# Patient Record
Sex: Male | Born: 2005 | Race: White | Hispanic: No | Marital: Single | Smoking: Never smoker
Health system: Southern US, Community
[De-identification: ages and names within clinical notes are randomized; demographics above are authoritative.]

## PROBLEM LIST (undated history)

## (undated) DIAGNOSIS — F329 Major depressive disorder, single episode, unspecified: Secondary | ICD-10-CM

## (undated) DIAGNOSIS — F419 Anxiety disorder, unspecified: Secondary | ICD-10-CM

## (undated) DIAGNOSIS — F909 Attention-deficit hyperactivity disorder, unspecified type: Secondary | ICD-10-CM

## (undated) DIAGNOSIS — Z7289 Other problems related to lifestyle: Secondary | ICD-10-CM

## (undated) DIAGNOSIS — N200 Calculus of kidney: Secondary | ICD-10-CM

## (undated) DIAGNOSIS — Z789 Other specified health status: Secondary | ICD-10-CM

---
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
## 2024-04-19 ENCOUNTER — Ambulatory Visit (HOSPITAL_COMMUNITY)
Admission: EM | Admit: 2024-04-19 | Discharge: 2024-04-19 | Disposition: A | Discharge status: Home / Self Care | Payer: MEDICAID | Attending: Psychiatry | Admitting: Psychiatry

## 2024-04-19 DIAGNOSIS — F319 Bipolar disorder, unspecified: Secondary | ICD-10-CM | POA: Insufficient documentation

## 2024-04-19 DIAGNOSIS — F4010 Social phobia, unspecified: Secondary | ICD-10-CM | POA: Insufficient documentation

## 2024-04-19 DIAGNOSIS — F909 Attention-deficit hyperactivity disorder, unspecified type: Secondary | ICD-10-CM | POA: Insufficient documentation

## 2024-04-19 DIAGNOSIS — Z760 Encounter for issue of repeat prescription: Secondary | ICD-10-CM

## 2024-04-19 DIAGNOSIS — Z79899 Other long term (current) drug therapy: Secondary | ICD-10-CM | POA: Insufficient documentation

## 2024-04-19 MED ORDER — LYBALVI 5-10 MG PO TABS: 10.0000 mg | ORAL_TABLET | Freq: Every day | ORAL | 0 refills | Status: AC

## 2024-04-19 MED ORDER — VILOXAZINE HCL ER 200 MG PO CP24: 200.0000 mg | ORAL_CAPSULE | Freq: Every day | ORAL | 0 refills | Status: AC
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
## 2024-04-19 NOTE — Discharge Instructions (Addendum)
  Based on what you have shared, a list of resources for outpatient therapy and psychiatry is provided below to get you started back on treatment.  It is imperative that you follow through with treatment within 5-7 days from the day of discharge to prevent any further risk to your safety or mental well-being.  You are not limited to the list provided.  In case of an urgent crisis, you may contact the Mobile Crisis Unit with [facility] at [phone].        Outpatient Services for Therapy and Medication Management for [facility]  [facility] [address], [place], [address] [phone] phone  New Patient Assessment/Therapy Walk-ins Monday, Wednesday, and Thursday arrive by 7:15 am     NO ASSESSMENT/THERAPY WALK-INS ON TUESDAYS OR THURSDAYS  New Patient Psychiatry/Medication Management Walk-ins Monday-Friday: 8am-11am  For all walk-ins, we ask that you arrive by 7:15am because patient will be seen in the order of arrival.  Availability is limited; therefore, you may not be seen on the same day that you walk-in.  Our goal is to serve and meet the needs of our community to the best of our ability.   [facility] [address] [place], [place], [address] [phone] phone  [facility] [address] [place], [place], [address] [phone] phone [phone] phone ([address], [address], Anthem/Elevance, [address], [place], [address], [address], Healthy [place], [place], [place], [address], [employer]oco[employer]ill[employer], [place], UHC, [employer] Financial, [place], Out of Network)  [employer], [place] [address] [place], [place], [address] [phone] phone ([address], Anthem/Elevance, San[employer]SCI Options/Carelon, BCBS, [address], [place], [place], [place], [place], [employer], [facility], [place], [place], [facility])  [employer] [address] [place], [place], [address] [phone] phone (Medicaid, ask about other insurance)  [facility] [address] [place], [place], [address] [phone] phone [phone] fax ([address], [place] , [place], [place], [place] Health Choice, UHC, General Electric, Self-Pay)  Sarah Lempka [address] [place], [place], [address] [phone] phone ([address], Anthem/Elevance, [address], [address], [place], [employer], [place], [place], [place], [employer], [place], [place], [place], [facility])  [facility] - 6-8 MONTH WAIT FOR THERAPY; SOON[facility] FOR MEDICATION MANAGEMENT [address] [place], [place], [address] [phone] phone ([address], AmeriHealth [address] - [place], [address], [place], [place], Friday Health Plans, [address], BCBS [place], [place], [address], [place], [place], [place], [place], Tricare, UHC, [employer]co Corporation, [place])  [facility] [address] [place], [place], [address] [phone] phone  [facility] [address] [place], [place], [address] [phone] phone  [facility] [address] [place], [place], [address] [phone] phone  [facility] - THERAPY ONLY [address]  [place], [place], [address] [phone] phone  [facility], [place] [address], [place], [address] [phone] phone  [facility] [address] [place], [place], [address] [phone] phone [phone] fax  [facility] [address] [place], [place], [address] [phone] phone [phone] fax  [facility] [phone] [address] [place], [place], [address] [phone] phone  Arnold Bicker [address] [place], [place], [address]  [phone] phone  [facility]  (Adults Only) [address] [place], [place], [address]  [phone]  phone [phone] fax

## 2024-04-19 NOTE — ED Provider Notes (Signed)
 [facility] Medical Screening Exam  Patient Name: Kenneth Keller MRN: [id number] Date of Evaluation: 04/19/24 Chief Complaint:  "I am almost out of Lybalvi and Qelbree" Diagnosis:  Final diagnoses:  Medication refill    History of Present illness: Kenneth Keller is a 18 y.o. male patient presented to [facility] as a walk in accompanied by his mother with complaints of "I am almost out of [place] and Qelbree"  Kenneth Keller, 18 y.o., male patient seen face to face by this provider, chart reviewed, and consulted with Dr. Genita Keys on 04/19/24.  Per chart review patient has a past psychiatric history of bipolar disorder.  He lives in the home with his mother.  He works full-time at [employer].  He endorses occasional marijuana use but denies all other substance use.  He endorses a past SI attempt when he was a teenager.  He has had multiple inpatient admissions but has not been hospitalized in the past few years.   Kenneth Keller reports he has services in place with [facility].  This provider Henretta Lodge NP and is prescribed Lybalvi 10 mg daily and Qelbree 200 daily.  Medications refilled at [facility] [place] location.  Contacted pharmacy at this location and patient last filled Qelbree 03/08/2024. [name] was last filled 02/06/2024.  Patient reports that his provider had considered increasing medication and gave him multiple bottles of samples.  He does present with a bottle of several medications with 5 left in bottle.  He reports compliance.  He feels that his mood has been stable and he is doing well.  He is denying any depression or anxiety.  He denies any mood instability.  He presents today because his provider quit in there is no other provider in the office that is willing to observe him.  They did refer him out but they got a call yesterday stating that the office that he was referred to is not accepting any new patients.  Patient is afraid if he does not have a new provider and a  refill of his medications that he will decompensate.  Discussed multiple outpatient medication management and therapy resources.  Including open access walk-in hours for [facility] on the second floor.  Patient agrees to present however he currently does not drive.  His mother is on contract as a physical therapist and  cannot provide him transportation this week.  Reports it would be next week before they would be able to present.  Provide 14-day prescription for Lybalvi and Qelbree.  Educated on medication and any adverse reactions.  Patient's mother and patient were provided opportunity ask questions or voice any concerns.  During evaluation [name] is  sitting in assessment room with no noted distress.  He is alert/oriented x 4, calm, cooperative, attentive, and responses were relevant and appropriate to assessment questions.  He spoke in a clear tone at moderate volume, and normal pace, with good eye contact.   He denies suicidal/self-harm/homicidal ideation, psychosis, and paranoia.  Objectively there is no evidence of psychosis/mania or delusional thinking.   He conversed coherently, with goal directed thoughts, and no distractibility, or pre-occupation and he has denied suicidal/self-harm/homicidal ideation, psychosis, and paranoia.   At this time Kenneth Keller is educated and verbalizes understanding of mental health resources and other crisis services in the community. He is instructed to call 911 and present to the nearest emergency room should he experience any suicidal/homicidal ideation, auditory/visual/hallucinations, or detrimental worsening of his mental health condition.  He  was a also advised by Clinical research associate that he could call the toll-free phone on back of  insurance card to assist with identifying counselors and agencies in network.     Flowsheet Row ED from 04/19/2024 in [facility] ED from 07/13/2021 in [facility] Emergency Department at [facility] ED to  Hosp-Admission (Discharged) from 07/07/2021 in [facility] PEDIATRICS  C-SSRS RISK CATEGORY No Risk Low Risk No Risk       Psychiatric Specialty Exam  Presentation  General Appearance:Appropriate for Environment; Casual  Eye Contact:Good  Speech:Clear and Coherent; Normal Rate  Speech Volume:Normal  Handedness:Right   Mood and Affect  Mood: Euthymic  Affect: Congruent   Thought Process  Thought Processes: Coherent  Descriptions of Associations:Intact  Orientation:Full (Time, Place and Person)  Thought Content:Logical    Hallucinations:None  Ideas of Reference:None  Suicidal Thoughts:No  Homicidal Thoughts:No   Sensorium  Memory: Immediate Good; Recent Good; Remote Good  Judgment:No data recorded Insight: Good   Executive Functions  Concentration: Good  Attention Span: Good  Recall: Good  Fund of Knowledge: Good  Language: Good   Psychomotor Activity  Psychomotor Activity: Normal   Assets  Assets: Housing; Manufacturing systems engineer; Desire for Improvement; Financial Resources/Insurance; Physical Health; Resilience; Social Support   Sleep  Sleep: Good  Number of hours: No data recorded  Physical Exam: Physical Exam Constitutional:      Appearance: Normal appearance.  Eyes:     General:        Right eye: No discharge.        Left eye: No discharge.  Cardiovascular:     Rate and Rhythm: Normal rate.  Pulmonary:     Effort: Pulmonary effort is normal. No respiratory distress.  Musculoskeletal:        General: Normal range of motion.     Cervical back: Normal range of motion.  Neurological:     Mental Status: He is alert and oriented to person, place, and time.  Psychiatric:        Attention and Perception: Attention and perception normal.        Mood and Affect: Mood and affect normal.        Speech: Speech normal.        Behavior: Behavior normal. Behavior is cooperative.        Thought Content:  Thought content normal.        Cognition and Memory: Cognition normal.        Judgment: Judgment normal.    Review of Systems  Constitutional:  Negative for chills and fever.  HENT:  Negative for hearing loss.   Respiratory:  Positive for shortness of breath. Negative for cough.   Cardiovascular:  Negative for chest pain.  Musculoskeletal: Negative.   Neurological:  Negative for tremors.   There were no vitals taken for this visit. There is no height or weight on file to calculate BMI.  Musculoskeletal: Strength & Muscle Tone: within normal limits Gait & Station: normal Patient leans: N/A   [facility] MSE Discharge Disposition for Follow up and Recommendations: Based on my evaluation the patient does not appear to have an emergency medical condition and can be discharged with resources and follow up care in outpatient services for Medication Management and Individual Therapy  Discharge patient   Provided out patient psychiatric resources for medication management, including open access walk in hours for [facility] on the 2cd floor  Provided 14 day prescription for Qelbree 200 mg every  day and Lybalvi 10 mg daily as bridge   Costella Dirks, NP 04/19/2024, 5:54 PM

## 2024-04-20 ENCOUNTER — Emergency Department (HOSPITAL_BASED_OUTPATIENT_CLINIC_OR_DEPARTMENT_OTHER): Payer: MEDICAID

## 2024-04-20 ENCOUNTER — Other Ambulatory Visit: Payer: Self-pay

## 2024-04-20 ENCOUNTER — Encounter (HOSPITAL_BASED_OUTPATIENT_CLINIC_OR_DEPARTMENT_OTHER): Payer: Self-pay | Admitting: Emergency Medicine

## 2024-04-20 ENCOUNTER — Emergency Department (HOSPITAL_BASED_OUTPATIENT_CLINIC_OR_DEPARTMENT_OTHER)
Admission: EM | Admit: 2024-04-20 | Discharge: 2024-04-20 | Disposition: A | Discharge status: Home / Self Care | Payer: MEDICAID | Attending: Emergency Medicine | Admitting: Emergency Medicine

## 2024-04-20 DIAGNOSIS — R1013 Epigastric pain: Secondary | ICD-10-CM | POA: Diagnosis not present

## 2024-04-20 DIAGNOSIS — R112 Nausea with vomiting, unspecified: Secondary | ICD-10-CM | POA: Insufficient documentation

## 2024-04-20 DIAGNOSIS — Z87442 Personal history of urinary calculi: Secondary | ICD-10-CM | POA: Diagnosis not present

## 2024-04-20 LAB — COMPREHENSIVE METABOLIC PANEL WITH GFR
ALT: 24 U/L (ref 0–44)
AST: 36 U/L (ref 15–41)
Albumin: 5.1 g/dL — ABNORMAL HIGH (ref 3.5–5.0)
Alkaline Phosphatase: 66 U/L (ref 38–126)
Anion gap: 18 — ABNORMAL HIGH (ref 5–15)
BUN: 16 mg/dL (ref 6–20)
CO2: 24 mmol/L (ref 22–32)
Calcium: 10.1 mg/dL (ref 8.9–10.3)
Chloride: 100 mmol/L (ref 98–111)
Creatinine, Ser: 1.06 mg/dL (ref 0.61–1.24)
GFR, Estimated: >60 mL/min (ref >60)
Glucose, Bld: 143 mg/dL — ABNORMAL HIGH (ref 70–99)
Potassium: 3.3 mmol/L — ABNORMAL LOW (ref 3.5–5.1)
Sodium: 141 mmol/L (ref 135–145)
Total Bilirubin: 0.3 mg/dL (ref 0.0–1.2)
Total Protein: 7.5 g/dL (ref 6.5–8.1)

## 2024-04-20 LAB — CBC WITH DIFFERENTIAL/PLATELET
Abs Immature Granulocytes: 0.09 10*3/uL — ABNORMAL HIGH (ref 0.00–0.07)
Basophils Absolute: 0.1 10*3/uL (ref 0.0–0.1)
Basophils Relative: 0 %
Eosinophils Absolute: 0.1 10*3/uL (ref 0.0–0.5)
Eosinophils Relative: 1 %
HCT: 44 % (ref 39.0–52.0)
Hemoglobin: 15.5 g/dL (ref 13.0–17.0)
Immature Granulocytes: 0 %
Lymphocytes Relative: 7 %
Lymphs Abs: 1.5 10*3/uL (ref 0.7–4.0)
MCH: 29.8 pg (ref 26.0–34.0)
MCHC: 35.2 g/dL (ref 30.0–36.0)
MCV: 84.6 fL (ref 80.0–100.0)
Monocytes Absolute: 1.6 10*3/uL — ABNORMAL HIGH (ref 0.1–1.0)
Monocytes Relative: 7 %
Neutro Abs: 18.3 10*3/uL — ABNORMAL HIGH (ref 1.7–7.7)
Neutrophils Relative %: 85 %
Platelets: 372 10*3/uL (ref 150–400)
RBC: 5.2 MIL/uL (ref 4.22–5.81)
RDW: 12.2 % (ref 11.5–15.5)
WBC: 22.1 10*3/uL — ABNORMAL HIGH (ref 4.0–10.5)
nRBC: 0 % (ref 0.0–0.2)

## 2024-04-20 LAB — LIPASE, BLOOD: Lipase: 40 U/L (ref 11–51)

## 2024-04-20 MED ORDER — MORPHINE SULFATE (PF) 4 MG/ML IV SOLN
4.0000 mg | Freq: Once | INTRAVENOUS | Status: AC
  Administered 2024-04-20: 4 mg via INTRAVENOUS
  Filled 2024-04-20: qty 1

## 2024-04-20 MED ORDER — ONDANSETRON HCL 4 MG/2ML IJ SOLN
4.0000 mg | Freq: Once | INTRAMUSCULAR | Status: AC
  Administered 2024-04-20: 4 mg via INTRAVENOUS
  Filled 2024-04-20: qty 2

## 2024-04-20 MED ORDER — ONDANSETRON 4 MG PO TBDP: 4.0000 mg | ORAL_TABLET | Freq: Three times a day (TID) | ORAL | 0 refills | Status: DC | PRN

## 2024-04-20 MED ORDER — DROPERIDOL 2.5 MG/ML IJ SOLN
1.2500 mg | Freq: Once | INTRAMUSCULAR | Status: AC
  Administered 2024-04-20: 1.25 mg via INTRAVENOUS
  Filled 2024-04-20: qty 2

## 2024-04-20 MED ORDER — KETOROLAC TROMETHAMINE 30 MG/ML IJ SOLN
30.0000 mg | Freq: Once | INTRAMUSCULAR | Status: AC
  Administered 2024-04-20: 30 mg via INTRAVENOUS
  Filled 2024-04-20: qty 1

## 2024-04-20 MED ORDER — SUCRALFATE 1 G PO TABS: 1.0000 g | ORAL_TABLET | Freq: Three times a day (TID) | ORAL | 0 refills | Status: DC

## 2024-04-20 MED ORDER — SODIUM CHLORIDE 0.9 % IV BOLUS
1000.0000 mL | Freq: Once | INTRAVENOUS | Status: AC
  Administered 2024-04-20: 1000 mL via INTRAVENOUS

## 2024-04-20 MED ORDER — IOHEXOL 300 MG/ML  SOLN
100.0000 mL | Freq: Once | INTRAMUSCULAR | Status: AC | PRN
  Administered 2024-04-20: 80 mL via INTRAVENOUS

## 2024-04-20 NOTE — Discharge Instructions (Signed)
 You were seen today for nausea and vomiting.  Your workup is largely reassuring.  Make sure that you are staying hydrated.  Take Zofran  as needed for persistent nausea.  Consider stopping marijuana use as this can sometimes be associated with persistent nausea and vomiting.

## 2024-04-20 NOTE — ED Triage Notes (Signed)
 Pt arrives w/ mom w/ c/o abd pain, n/v that began tonight. Reports same thing happened a week ago and had no answers for his s/s.

## 2024-04-20 NOTE — ED Notes (Signed)
 Questions and concerns addressed. Discharge teaching completed.   Prescriptions reviewed and pharmacy verified.   Pt ambulatory out of ED with mother.

## 2024-04-20 NOTE — ED Notes (Signed)
 Patient tolerated PO challenge

## 2024-04-20 NOTE — ED Provider Notes (Signed)
 [facility] EMERGENCY DEPARTMENT AT [facility] Provider Note   CSN: [id number] Arrival date & time: 04/20/24  0240     History  Chief Complaint  Patient presents with   Abdominal Pain   Emesis    Florence Antonelli is a 18 y.o. male.  HPI     This is a 18 year old male who presents with nausea and vomiting.  Patient states that he has had persistent nausea and vomiting since earlier this evening.  Previously has been seen for this and "was not given an answer."  Reports epigastric discomfort and burning with vomiting.  No change in stools.  Does report marijuana use.  No sick contacts.  No fevers.  Home Medications Prior to Admission medications   Medication Sig Start Date End Date Taking? Authorizing Provider  ondansetron  (ZOFRAN -ODT) 4 MG disintegrating tablet Take 1 tablet (4 mg total) by mouth every 8 (eight) hours as needed. 04/20/24  Yes Banesa Tristan, Vonzella Guernsey, MD  sucralfate (CARAFATE) 1 g tablet Take 1 tablet (1 g total) by mouth 4 (four) times daily -  with meals and at bedtime. 04/20/24  Yes Morna Flud, Vonzella Guernsey, MD  cetirizine (ZYRTEC) 10 MG tablet Take 10 mg by mouth daily.    [provider]  OLANZapine-Samidorphan (LYBALVI) 5-10 MG TABS Take 10 mg by mouth daily for 14 days. 04/19/24 05/03/24  Costella Dirks, NP  viloxazine ER (QELBREE) 200 MG 24 hr capsule Take 1 capsule (200 mg total) by mouth daily. 04/19/24   Costella Dirks, NP      Allergies    Other    Review of Systems   Review of Systems  Constitutional:  Negative for fever.  Respiratory:  Negative for shortness of breath.   Cardiovascular:  Negative for chest pain.  Gastrointestinal:  Positive for abdominal pain, nausea and vomiting. Negative for constipation and diarrhea.  All other systems reviewed and are negative.   Physical Exam Updated Vital Signs BP 123/81   Pulse 66   Temp 97.8 F (36.6 C) (Oral)   Resp 15   Ht 1.613 m (5' 3.5")   Wt 65.8 kg   SpO2 100%   BMI 25.28 kg/m   Physical Exam Vitals and nursing note reviewed.  Constitutional:      Appearance: He is well-developed. He is ill-appearing. He is not toxic-appearing.     Comments: Actively retching  HENT:     Head: Normocephalic and atraumatic.  Eyes:     Pupils: Pupils are equal, round, and reactive to light.  Cardiovascular:     Rate and Rhythm: Normal rate and regular rhythm.     Heart sounds: Normal heart sounds. No murmur heard. Pulmonary:     Effort: Pulmonary effort is normal. No respiratory distress.     Breath sounds: Normal breath sounds. No wheezing.  Abdominal:     Palpations: Abdomen is soft.     Tenderness: There is abdominal tenderness in the epigastric area.  Musculoskeletal:     Cervical back: Neck supple.  Lymphadenopathy:     Cervical: No cervical adenopathy.  Skin:    General: Skin is warm and dry.  Neurological:     Mental Status: He is alert and oriented to person, place, and time.  Psychiatric:        Mood and Affect: Mood normal.     ED Results / Procedures / Treatments   Labs (all labs ordered are listed, but only abnormal results are displayed) Labs Reviewed  CBC WITH DIFFERENTIAL/PLATELET -  Abnormal; Notable for the following components:      Result Value   WBC 22.1 (*)    Neutro Abs 18.3 (*)    Monocytes Absolute 1.6 (*)    Abs Immature Granulocytes 0.09 (*)    All other components within normal limits  COMPREHENSIVE METABOLIC PANEL WITH GFR - Abnormal; Notable for the following components:   Potassium 3.3 (*)    Glucose, Bld 143 (*)    Albumin 5.1 (*)    Anion gap 18 (*)    All other components within normal limits  LIPASE, BLOOD    EKG None  Radiology CT ABDOMEN PELVIS W CONTRAST Result Date: 04/20/2024 CLINICAL DATA:  Abdominal pain and vomiting. Small-bowel obstruction suspected. EXAM: CT ABDOMEN AND PELVIS WITH CONTRAST TECHNIQUE: Multidetector CT imaging of the abdomen and pelvis was performed using the standard protocol following bolus  administration of intravenous contrast. RADIATION DOSE REDUCTION: This exam was performed according to the departmental dose-optimization program which includes automated exposure control, adjustment of the mA and/or kV according to patient size and/or use of iterative reconstruction technique. CONTRAST:  80mL OMNIPAQUE  IOHEXOL  300 MG/ML  SOLN COMPARISON:  CT with IV contrast 09/30/2019 FINDINGS: Lower chest: Lung bases are clear. The cardiac size is normal. There is a new small hiatal hernia. Hepatobiliary: No focal liver abnormality is seen. No gallstones, gallbladder wall thickening, or biliary dilatation. Pancreas: No abnormality. Spleen: No abnormality.  No splenomegaly. Adrenals/Urinary Tract: No abnormality. Stomach/Bowel: Unremarkable gastric wall. The unopacified small bowel is normal caliber without inflammatory changes. The appendix is normal. The wall of the colon is normal in thickness. Mild fecal stasis. Vascular/Lymphatic: No significant vascular findings are present. No enlarged abdominal or pelvic lymph nodes. Reproductive: Prostate is unremarkable. Other: Small umbilical fat hernia. No incarcerated hernia. No free fluid, free hemorrhage or free air. Pelvic phleboliths. Musculoskeletal: No acute or significant osseous findings. IMPRESSION: 1. No acute CT findings in the abdomen or pelvis. 2. Constipation. 3. Small hiatal hernia. 4. Small umbilical fat hernia. Electronically Signed   By: Denman Fischer M.D.   On: 04/20/2024 05:16    Procedures Procedures    Medications Ordered in ED Medications  ketorolac  (TORADOL ) 30 MG/ML injection 30 mg (has no administration in time range)  sodium chloride  0.9 % bolus 1,000 mL (0 mLs Intravenous Stopped 04/20/24 0426)  ondansetron  (ZOFRAN ) injection 4 mg (4 mg Intravenous Given 04/20/24 0312)  morphine  (PF) 4 MG/ML injection 4 mg (4 mg Intravenous Given 04/20/24 0406)  iohexol  (OMNIPAQUE ) 300 MG/ML solution 100 mL (80 mLs Intravenous Contrast Given 04/20/24  0503)  droperidol (INAPSINE) 2.5 MG/ML injection 1.25 mg (1.25 mg Intravenous Given 04/20/24 0439)    ED Course/ Medical Decision Making/ A&P Clinical Course as of 04/20/24 0558  Mon Apr 20, 2024  0539 Patient tolerating fluids.  Recommend that he stop marijuana use. [CH]    Clinical Course User Index [CH] Braley Luckenbaugh, Vonzella Guernsey, MD                                 Medical Decision Making Amount and/or Complexity of Data Reviewed Labs: ordered. Radiology: ordered.  Risk Prescription drug management.   This patient presents to the ED for concern of abdominal pain, nausea, vomiting, this involves an extensive number of treatment options, and is a complaint that carries with it a high risk of complications and morbidity.  I considered the following differential and admission for this acute,  potentially life threatening condition.  The differential diagnosis includes gastritis, gastroenteritis, cannabinoid hyperemesis, SBO, pancreatitis, cholecystitis  MDM:    This is a 18 year old male who presents with nausea and vomiting.  He is actively retching on my initial evaluation and appears uncomfortable.  Labs obtained.  Leukocytosis to 22.  Potassium 3.3.  Patient was given fluids, pain, nausea medication.  CT imaging obtained.  CT does not show any evidence of acute finding.  He does have a small hiatal hernia which could be worsening symptoms.  Would also consider marijuana as a culprit.  Ultimately patient was able to p.o. challenge.  Will discharge with Carafate and Zofran  for symptoms.  Will give GI follow-up.  (Labs, imaging, consults)  Labs: I Ordered, and personally interpreted labs.  The pertinent results include: CBC, CMP, lipase  Imaging Studies ordered: I ordered imaging studies including CT I independently visualized and interpreted imaging. I agree with the radiologist interpretation  Additional history obtained from chart review.  External records from outside source obtained  and reviewed including prior evaluations  Cardiac Monitoring: The patient was maintained on a cardiac monitor.  If on the cardiac monitor, I personally viewed and interpreted the cardiac monitored which showed an underlying rhythm of: Sinus  Reevaluation: After the interventions noted above, I reevaluated the patient and found that they have :improved  Social Determinants of Health:  lives independently  Disposition: Discharge  Co morbidities that complicate the patient evaluation  Past Medical History:  Diagnosis Date   ADHD    Anxiety    Kidney stones    MDD (major depressive disorder)    Self-mutilation      Medicines Meds ordered this encounter  Medications   sodium chloride  0.9 % bolus 1,000 mL   ondansetron  (ZOFRAN ) injection 4 mg   morphine  (PF) 4 MG/ML injection 4 mg   iohexol  (OMNIPAQUE ) 300 MG/ML solution 100 mL   droperidol (INAPSINE) 2.5 MG/ML injection 1.25 mg   ketorolac  (TORADOL ) 30 MG/ML injection 30 mg   ondansetron  (ZOFRAN -ODT) 4 MG disintegrating tablet    Sig: Take 1 tablet (4 mg total) by mouth every 8 (eight) hours as needed.    Dispense:  20 tablet    Refill:  0   sucralfate (CARAFATE) 1 g tablet    Sig: Take 1 tablet (1 g total) by mouth 4 (four) times daily -  with meals and at bedtime.    Dispense:  90 tablet    Refill:  0    I have reviewed the patients home medicines and have made adjustments as needed  Problem List / ED Course: Problem List Items Addressed This Visit   None Visit Diagnoses       Nausea and vomiting, unspecified vomiting type    -  Primary                   Final Clinical Impression(s) / ED Diagnoses Final diagnoses:  Nausea and vomiting, unspecified vomiting type    Rx / DC Orders ED Discharge Orders          Ordered    ondansetron  (ZOFRAN -ODT) 4 MG disintegrating tablet  Every 8 hours PRN        04/20/24 0557    sucralfate (CARAFATE) 1 g tablet  3 times daily with meals & bedtime         04/20/24 0557              Rory Collard, MD 04/20/24 [phone]

## 2024-06-01 ENCOUNTER — Inpatient Hospital Stay (HOSPITAL_BASED_OUTPATIENT_CLINIC_OR_DEPARTMENT_OTHER)
Admission: EM | Admit: 2024-06-01 | Discharge: 2024-06-03 | DRG: 392 | Disposition: A | Discharge status: Home / Self Care | Payer: MEDICAID | Attending: Internal Medicine | Admitting: Internal Medicine

## 2024-06-01 ENCOUNTER — Other Ambulatory Visit: Payer: Self-pay

## 2024-06-01 ENCOUNTER — Emergency Department (HOSPITAL_BASED_OUTPATIENT_CLINIC_OR_DEPARTMENT_OTHER): Payer: MEDICAID

## 2024-06-01 ENCOUNTER — Encounter (HOSPITAL_BASED_OUTPATIENT_CLINIC_OR_DEPARTMENT_OTHER): Payer: Self-pay

## 2024-06-01 DIAGNOSIS — F12188 Cannabis abuse with other cannabis-induced disorder: Secondary | ICD-10-CM | POA: Diagnosis present

## 2024-06-01 DIAGNOSIS — F32A Depression, unspecified: Secondary | ICD-10-CM | POA: Diagnosis present

## 2024-06-01 DIAGNOSIS — R9431 Abnormal electrocardiogram [ECG] [EKG]: Secondary | ICD-10-CM | POA: Diagnosis present

## 2024-06-01 DIAGNOSIS — D72829 Elevated white blood cell count, unspecified: Secondary | ICD-10-CM | POA: Diagnosis present

## 2024-06-01 DIAGNOSIS — R1110 Vomiting, unspecified: Principal | ICD-10-CM

## 2024-06-01 DIAGNOSIS — R519 Headache, unspecified: Secondary | ICD-10-CM | POA: Diagnosis present

## 2024-06-01 DIAGNOSIS — K449 Diaphragmatic hernia without obstruction or gangrene: Secondary | ICD-10-CM | POA: Diagnosis present

## 2024-06-01 DIAGNOSIS — F329 Major depressive disorder, single episode, unspecified: Secondary | ICD-10-CM | POA: Diagnosis present

## 2024-06-01 DIAGNOSIS — R112 Nausea with vomiting, unspecified: Secondary | ICD-10-CM | POA: Diagnosis not present

## 2024-06-01 DIAGNOSIS — F909 Attention-deficit hyperactivity disorder, unspecified type: Secondary | ICD-10-CM | POA: Diagnosis present

## 2024-06-01 DIAGNOSIS — K529 Noninfective gastroenteritis and colitis, unspecified: Secondary | ICD-10-CM | POA: Diagnosis present

## 2024-06-01 DIAGNOSIS — Z9152 Personal history of nonsuicidal self-harm: Secondary | ICD-10-CM

## 2024-06-01 DIAGNOSIS — E872 Acidosis, unspecified: Secondary | ICD-10-CM | POA: Diagnosis present

## 2024-06-01 DIAGNOSIS — Z8249 Family history of ischemic heart disease and other diseases of the circulatory system: Secondary | ICD-10-CM

## 2024-06-01 DIAGNOSIS — E876 Hypokalemia: Secondary | ICD-10-CM | POA: Diagnosis not present

## 2024-06-01 DIAGNOSIS — F39 Unspecified mood [affective] disorder: Secondary | ICD-10-CM | POA: Diagnosis present

## 2024-06-01 DIAGNOSIS — F1210 Cannabis abuse, uncomplicated: Secondary | ICD-10-CM | POA: Diagnosis present

## 2024-06-01 DIAGNOSIS — F419 Anxiety disorder, unspecified: Secondary | ICD-10-CM | POA: Diagnosis present

## 2024-06-01 DIAGNOSIS — F1219 Cannabis abuse with unspecified cannabis-induced disorder: Secondary | ICD-10-CM | POA: Diagnosis present

## 2024-06-01 LAB — CBC WITH DIFFERENTIAL/PLATELET
Abs Immature Granulocytes: 0.07 K/uL (ref 0.00–0.07)
Basophils Absolute: 0.1 K/uL (ref 0.0–0.1)
Basophils Relative: 1 %
Eosinophils Absolute: 0.1 K/uL (ref 0.0–0.5)
Eosinophils Relative: 1 %
HCT: 44.4 % (ref 39.0–52.0)
Hemoglobin: 15.7 g/dL (ref 13.0–17.0)
Immature Granulocytes: 0 %
Lymphocytes Relative: 7 %
Lymphs Abs: 1.2 K/uL (ref 0.7–4.0)
MCH: 29.3 pg (ref 26.0–34.0)
MCHC: 35.4 g/dL (ref 30.0–36.0)
MCV: 82.8 fL (ref 80.0–100.0)
Monocytes Absolute: 1.1 K/uL — ABNORMAL HIGH (ref 0.1–1.0)
Monocytes Relative: 7 %
Neutro Abs: 15 K/uL — ABNORMAL HIGH (ref 1.7–7.7)
Neutrophils Relative %: 84 %
Platelets: 304 K/uL (ref 150–400)
RBC: 5.36 MIL/uL (ref 4.22–5.81)
RDW: 11.7 % (ref 11.5–15.5)
WBC: 17.7 K/uL — ABNORMAL HIGH (ref 4.0–10.5)
nRBC: 0 % (ref 0.0–0.2)

## 2024-06-01 LAB — RAPID URINE DRUG SCREEN, HOSP PERFORMED
Amphetamines: NOT DETECTED
Barbiturates: NOT DETECTED
Benzodiazepines: POSITIVE — AB
Cocaine: NOT DETECTED
Opiates: POSITIVE — AB
Tetrahydrocannabinol: POSITIVE — AB

## 2024-06-01 LAB — URINALYSIS, ROUTINE W REFLEX MICROSCOPIC
Bilirubin Urine: NEGATIVE
Glucose, UA: NEGATIVE mg/dL
Hgb urine dipstick: NEGATIVE
Ketones, ur: 80 mg/dL — AB
Leukocytes,Ua: NEGATIVE
Nitrite: NEGATIVE
Protein, ur: NEGATIVE mg/dL
Specific Gravity, Urine: 1.028 (ref 1.005–1.030)
pH: 6 (ref 5.0–8.0)

## 2024-06-01 LAB — COMPREHENSIVE METABOLIC PANEL WITH GFR
ALT: 24 U/L (ref 0–44)
AST: 38 U/L (ref 15–41)
Albumin: 5.3 g/dL — ABNORMAL HIGH (ref 3.5–5.0)
Alkaline Phosphatase: 59 U/L (ref 38–126)
Anion gap: 19 — ABNORMAL HIGH (ref 5–15)
BUN: 17 mg/dL (ref 6–20)
CO2: 21 mmol/L — ABNORMAL LOW (ref 22–32)
Calcium: 10.2 mg/dL (ref 8.9–10.3)
Chloride: 103 mmol/L (ref 98–111)
Creatinine, Ser: 1.08 mg/dL (ref 0.61–1.24)
GFR, Estimated: >60 mL/min (ref >60)
Glucose, Bld: 136 mg/dL — ABNORMAL HIGH (ref 70–99)
Potassium: 3.1 mmol/L — ABNORMAL LOW (ref 3.5–5.1)
Sodium: 142 mmol/L (ref 135–145)
Total Bilirubin: 0.3 mg/dL (ref 0.0–1.2)
Total Protein: 7.7 g/dL (ref 6.5–8.1)

## 2024-06-01 LAB — LIPASE, BLOOD: Lipase: 225 U/L — ABNORMAL HIGH (ref 11–51)

## 2024-06-01 LAB — MAGNESIUM: Magnesium: 1.9 mg/dL (ref 1.7–2.4)

## 2024-06-01 LAB — HIV ANTIBODY (ROUTINE TESTING W REFLEX): HIV Screen 4th Generation wRfx: NONREACTIVE

## 2024-06-01 MED ORDER — HYDROMORPHONE HCL 1 MG/ML IJ SOLN
1.0000 mg | Freq: Once | INTRAMUSCULAR | Status: AC
  Administered 2024-06-01: 1 mg via INTRAVENOUS
  Filled 2024-06-01: qty 1

## 2024-06-01 MED ORDER — VILOXAZINE HCL ER 200 MG PO CP24: 200.0000 mg | ORAL_CAPSULE | Freq: Every day | ORAL | Status: DC

## 2024-06-01 MED ORDER — SODIUM CHLORIDE 0.9% FLUSH
3.0000 mL | Freq: Two times a day (BID) | INTRAVENOUS | Status: DC
  Administered 2024-06-01 – 2024-06-03 (×5): 3 mL via INTRAVENOUS

## 2024-06-01 MED ORDER — DIAZEPAM 5 MG/ML IJ SOLN
2.5000 mg | Freq: Once | INTRAMUSCULAR | Status: AC
  Administered 2024-06-01: 2.5 mg via INTRAVENOUS
  Filled 2024-06-01: qty 2

## 2024-06-01 MED ORDER — ACETAMINOPHEN 325 MG PO TABS
650.0000 mg | ORAL_TABLET | Freq: Four times a day (QID) | ORAL | Status: DC | PRN
  Administered 2024-06-02: 650 mg via ORAL
  Filled 2024-06-01 (×2): qty 2

## 2024-06-01 MED ORDER — METOCLOPRAMIDE HCL 5 MG/ML IJ SOLN
10.0000 mg | Freq: Once | INTRAMUSCULAR | Status: AC
  Administered 2024-06-01: 10 mg via INTRAVENOUS
  Filled 2024-06-01: qty 2

## 2024-06-01 MED ORDER — IOHEXOL 300 MG/ML  SOLN
100.0000 mL | Freq: Once | INTRAMUSCULAR | Status: AC | PRN
  Administered 2024-06-01: 80 mL via INTRAVENOUS

## 2024-06-01 MED ORDER — PANTOPRAZOLE SODIUM 40 MG IV SOLR
40.0000 mg | Freq: Two times a day (BID) | INTRAVENOUS | Status: DC
  Administered 2024-06-01 – 2024-06-03 (×5): 40 mg via INTRAVENOUS
  Filled 2024-06-01 (×5): qty 10

## 2024-06-01 MED ORDER — PANTOPRAZOLE SODIUM 40 MG IV SOLR
40.0000 mg | Freq: Once | INTRAVENOUS | Status: AC
  Administered 2024-06-01: 40 mg via INTRAVENOUS
  Filled 2024-06-01: qty 10

## 2024-06-01 MED ORDER — ALUM & MAG HYDROXIDE-SIMETH 200-200-20 MG/5ML PO SUSP
30.0000 mL | Freq: Once | ORAL | Status: AC
  Administered 2024-06-01: 30 mL via ORAL
  Filled 2024-06-01: qty 30

## 2024-06-01 MED ORDER — OLANZAPINE-SAMIDORPHAN 10-10 MG PO TABS: 1.0000 | ORAL_TABLET | Freq: Every day | ORAL | Status: DC

## 2024-06-01 MED ORDER — ONDANSETRON HCL 4 MG/2ML IJ SOLN
4.0000 mg | Freq: Once | INTRAMUSCULAR | Status: AC
  Administered 2024-06-01: 4 mg via INTRAVENOUS
  Filled 2024-06-01: qty 2

## 2024-06-01 MED ORDER — ACETAMINOPHEN 650 MG RE SUPP
650.0000 mg | Freq: Four times a day (QID) | RECTAL | Status: DC | PRN
  Administered 2024-06-01 – 2024-06-02 (×2): 650 mg via RECTAL
  Filled 2024-06-01 (×3): qty 1

## 2024-06-01 MED ORDER — POTASSIUM CHLORIDE 10 MEQ/100ML IV SOLN
10.0000 meq | INTRAVENOUS | Status: AC
  Administered 2024-06-01: 10 meq via INTRAVENOUS
  Filled 2024-06-01 (×2): qty 100

## 2024-06-01 MED ORDER — KETOROLAC TROMETHAMINE 15 MG/ML IJ SOLN
15.0000 mg | Freq: Once | INTRAMUSCULAR | Status: AC
  Administered 2024-06-01: 15 mg via INTRAVENOUS
  Filled 2024-06-01: qty 1

## 2024-06-01 MED ORDER — ENOXAPARIN SODIUM 40 MG/0.4ML IJ SOSY
40.0000 mg | PREFILLED_SYRINGE | INTRAMUSCULAR | Status: DC
  Administered 2024-06-01: 40 mg via SUBCUTANEOUS
  Filled 2024-06-01 (×2): qty 0.4

## 2024-06-01 MED ORDER — HYOSCYAMINE SULFATE 0.125 MG SL SUBL
0.2500 mg | SUBLINGUAL_TABLET | Freq: Once | SUBLINGUAL | Status: AC
  Administered 2024-06-01: 0.25 mg via SUBLINGUAL
  Filled 2024-06-01: qty 2

## 2024-06-01 MED ORDER — SODIUM CHLORIDE 0.9 % IV BOLUS
1000.0000 mL | Freq: Once | INTRAVENOUS | Status: AC
  Administered 2024-06-01: 1000 mL via INTRAVENOUS

## 2024-06-01 MED ORDER — HYDROMORPHONE HCL 1 MG/ML IJ SOLN
0.5000 mg | Freq: Once | INTRAMUSCULAR | Status: AC
  Administered 2024-06-01: 0.5 mg via INTRAVENOUS
  Filled 2024-06-01: qty 1

## 2024-06-01 MED ORDER — SODIUM CHLORIDE 0.9 % IV SOLN
INTRAVENOUS | Status: AC
  Filled 2024-06-01 (×2): qty 1000

## 2024-06-01 MED ORDER — MAGNESIUM SULFATE 2 GM/50ML IV SOLN
2.0000 g | Freq: Once | INTRAVENOUS | Status: AC
  Administered 2024-06-01: 2 g via INTRAVENOUS
  Filled 2024-06-01: qty 50

## 2024-06-01 MED ORDER — TRIMETHOBENZAMIDE HCL 100 MG/ML IM SOLN
200.0000 mg | Freq: Four times a day (QID) | INTRAMUSCULAR | Status: DC | PRN
  Administered 2024-06-01 – 2024-06-03 (×6): 200 mg via INTRAMUSCULAR
  Filled 2024-06-01 (×9): qty 2

## 2024-06-01 NOTE — ED Triage Notes (Signed)
 Pt states he has been vomiting for an hour & half with abdominal pain. Denies any other s/s at time of triage.

## 2024-06-01 NOTE — Progress Notes (Signed)
 Plan of Care Note for accepted transfer   Patient: Kenneth Keller MRN: [id number]   DOA: 06/01/2024  Facility requesting transfer: [facility]. Requesting Provider: Raynell Naegeli, MD. Reason for transfer: Intractable nausea and vomiting, hypokalemia, prolonged QT interval. Facility course:  [name] is a 18 y.o. male with a past medical history listed below who presents to the emergency department with several hours of nausea vomiting and upper abdominal pain similar to prior episodes related to cannabinoid use.  Patient reports that he has cut back but still using delta 8.  Denies any recent fevers or infections.  No diarrhea.  Abdominal pain began after emesis.  No illicit drug use.   EKG: Vent. rate 67 BPM PR interval 229 ms QRS duration 108 ms QT/QTcB 526/556 ms P-R-T axes 145 81 69 Sinus or ectopic atrial rhythm Prolonged PR interval Biatrial enlargement ST elevation suggests acute pericarditis Prolonged QT interval  Plan of care: The patient is accepted for admission to Telemetry unit, at [facility].  He has received IV fluids, potassium supplement.  Magnesium  level is pending..   Author: Alm Dorn Castor, MD 06/01/2024  Check [email or web address] for on-call coverage.  Nursing staff, Please call [facility] Admits & Consults System-Wide number on Amion as soon as patient's arrival, so appropriate admitting provider can evaluate the pt.

## 2024-06-01 NOTE — ED Provider Notes (Signed)
 [place] EMERGENCY DEPARTMENT AT [facility] Provider Note  CSN: [id number] Arrival date & time: 06/01/24 0119  Chief Complaint(s) Emesis  HPI Kenneth Keller is a 17 y.o. male with a past medical history listed below who presents to the emergency department with several hours of nausea vomiting and upper abdominal pain similar to prior episodes related to cannabinoid use.  Patient reports that he has cut back but still using delta 8.  Denies any recent fevers or infections.  No diarrhea.  Abdominal pain began after emesis.  No illicit drug use.   Emesis   Past Medical History Past Medical History:  Diagnosis Date  . ADHD   . Anxiety   . Kidney stones   . MDD (major depressive disorder)   . Self-mutilation    Patient Active Problem List   Diagnosis Date Noted  . Sudden onset of severe headache 07/08/2021  . Headache 07/08/2021  . Bipolar I disorder (HCC) 02/23/2021  . Renal colic on right side 10/07/2019  . Renal calculi 10/07/2019  . Cannabis use disorder, mild, abuse 04/04/2019  . Nicotine  abuse 04/04/2019  . Self-injurious behavior 04/04/2019  . MDD (major depressive disorder), recurrent episode (HCC) 04/03/2019  . ADHD    Home Medication(s) Prior to Admission medications   Medication Sig Start Date End Date Taking? Authorizing Provider  cetirizine (ZYRTEC) 10 MG tablet Take 10 mg by mouth daily.    [provider]  ondansetron  (ZOFRAN -ODT) 4 MG disintegrating tablet Take 1 tablet (4 mg total) by mouth every 8 (eight) hours as needed. 04/20/24   Horton, Charmaine FALCON, MD  sucralfate  (CARAFATE ) 1 g tablet Take 1 tablet (1 g total) by mouth 4 (four) times daily -  with meals and at bedtime. 04/20/24   Horton, Charmaine FALCON, MD  viloxazine ER (QELBREE) 200 MG 24 hr capsule Take 1 capsule (200 mg total) by mouth daily. 04/19/24   Mardy Elveria DEL, NP                                                                                                                                     Allergies Other  Review of Systems Review of Systems  Gastrointestinal:  Positive for vomiting.   As noted in HPI  Physical Exam Vital Signs  I have reviewed the triage vital signs BP 123/68   Pulse 81   Temp (!) 97.4 F (36.3 C) (Oral)   Resp 20   Ht 5' 5 (1.651 m)   Wt 63.5 kg   SpO2 100%   BMI 23.30 kg/m   Physical Exam Vitals reviewed.  Constitutional:      General: He is not in acute distress.    Appearance: He is well-developed. He is not diaphoretic.  HENT:     Head: Normocephalic and atraumatic.     Right Ear: External ear normal.     Left Ear: External ear normal.  Nose: Nose normal.     Mouth/Throat:     Mouth: Mucous membranes are moist.  Eyes:     General: No scleral icterus.    Conjunctiva/sclera: Conjunctivae normal.  Neck:     Trachea: Phonation normal.  Cardiovascular:     Rate and Rhythm: Normal rate and regular rhythm.  Pulmonary:     Effort: Pulmonary effort is normal. No respiratory distress.     Breath sounds: No stridor.  Abdominal:     General: There is no distension.     Tenderness: There is generalized abdominal tenderness. There is no guarding or rebound.  Musculoskeletal:        General: Normal range of motion.     Cervical back: Normal range of motion.  Neurological:     Mental Status: He is alert and oriented to person, place, and time.  Psychiatric:        Behavior: Behavior normal.     ED Results and Treatments Labs (all labs ordered are listed, but only abnormal results are displayed) Labs Reviewed  COMPREHENSIVE METABOLIC PANEL WITH GFR - Abnormal; Notable for the following components:      Result Value   Potassium 3.1 (*)    CO2 21 (*)    Glucose, Bld 136 (*)    Albumin 5.3 (*)    Anion gap 19 (*)    All other components within normal limits  LIPASE, BLOOD - Abnormal; Notable for the following components:   Lipase 225 (*)    All other components within normal limits  CBC WITH  DIFFERENTIAL/PLATELET - Abnormal; Notable for the following components:   WBC 17.7 (*)    Neutro Abs 15.0 (*)    Monocytes Absolute 1.1 (*)    All other components within normal limits                                                                                                                         EKG  EKG Interpretation Date/Time:  Monday June 01 2024 06:37:46 EDT Ventricular Rate:  67 PR Interval:  229 QRS Duration:  108 QT Interval:  526 QTC Calculation: 556 R Axis:   81  Text Interpretation: Sinus or ectopic atrial rhythm Prolonged PR interval Biatrial enlargement ST elevation suggests acute pericarditis Prolonged QT interval Confirmed by Trine Likes [phone]) on 06/01/2024 6:50:27 AM       Radiology CT ABDOMEN PELVIS W CONTRAST Result Date: 06/01/2024 CLINICAL DATA:  Abdominal pain. Pancreatitis suspected. Possible recurring cannabis related hyperemesis syndrome. Similar symptoms last month. EXAM: CT ABDOMEN AND PELVIS WITH CONTRAST TECHNIQUE: Multidetector CT imaging of the abdomen and pelvis was performed using the standard protocol following bolus administration of intravenous contrast. RADIATION DOSE REDUCTION: This exam was performed according to the departmental dose-optimization program which includes automated exposure control, adjustment of the mA and/or kV according to patient size and/or use of iterative reconstruction technique. CONTRAST:  80mL OMNIPAQUE  IOHEXOL  300 MG/ML  SOLN COMPARISON:  CT with IV contrast  04/20/2024, CT with IV contrast 09/30/2019. FINDINGS: Lower chest: No acute abnormality. Clear lung bases. Normal cardiac size. Small hiatal hernia. Hepatobiliary: No abnormality. Pancreas: No abnormality. Specifically, no mass enhancement, ductal dilatation or peripancreatic inflammation. Spleen: No abnormality. Adrenals/Urinary Tract: No abnormality. Stomach/Bowel: There are thickened folds in the proximal stomach. There are thickened folds in the jejunum.  Findings consistent with gastroenteritis. There is no small bowel dilatation or inflammatory change. There is a well visible normal appendix. The large intestine is unremarkable. Vascular/Lymphatic: No significant vascular findings are present. No enlarged abdominal or pelvic lymph nodes. Reproductive: Prostate is unremarkable. Other: Small umbilical fat hernia. No incarcerated hernia. No free fluid. Pelvic phleboliths. Musculoskeletal: No acute or significant osseous findings. IMPRESSION: 1. Thickened folds in the proximal stomach and jejunum consistent with gastroenteritis. No mesenteric inflammatory change. 2. Small hiatal hernia.  Small umbilical fat hernia. 3. No CT findings of pancreatitis. Electronically Signed   By: Francis Quam M.D.   On: 06/01/2024 03:50    Medications Ordered in ED Medications  metoCLOPramide  (REGLAN ) injection 10 mg (10 mg Intravenous Given 06/01/24 0230)  pantoprazole  (PROTONIX ) injection 40 mg (40 mg Intravenous Given 06/01/24 0230)  alum & mag hydroxide-simeth (MAALOX/MYLANTA) 200-200-20 MG/5ML suspension 30 mL (30 mLs Oral Given 06/01/24 0230)  sodium chloride  0.9 % bolus 1,000 mL (0 mLs Intravenous Stopped 06/01/24 0640)  ketorolac  (TORADOL ) 15 MG/ML injection 15 mg (15 mg Intravenous Given 06/01/24 0321)  iohexol  (OMNIPAQUE ) 300 MG/ML solution 100 mL (80 mLs Intravenous Contrast Given 06/01/24 0324)  sodium chloride  0.9 % bolus 1,000 mL (0 mLs Intravenous Stopped 06/01/24 0640)  hyoscyamine  (LEVSIN  SL) SL tablet 0.25 mg (0.25 mg Sublingual Given 06/01/24 0422)  HYDROmorphone  (DILAUDID ) injection 0.5 mg (0.5 mg Intravenous Given 06/01/24 0513)  ondansetron  (ZOFRAN ) injection 4 mg (4 mg Intravenous Given 06/01/24 0513)  diazepam  (VALIUM ) injection 2.5 mg (2.5 mg Intravenous Given 06/01/24 0646)   Procedures .Critical Care  Performed by: Trine Raynell Moder, MD Authorized by: Trine Raynell Moder, MD   Critical care provider statement:    Critical care time  (minutes):  86   Critical care time was exclusive of:  Separately billable procedures and treating other patients   Critical care was necessary to treat or prevent imminent or life-threatening deterioration of the following conditions:  Dehydration and toxidrome   Critical care was time spent personally by me on the following activities:  Development of treatment plan with patient or surrogate, discussions with consultants, evaluation of patient's response to treatment, examination of patient, obtaining history from patient or surrogate, review of old charts, re-evaluation of patient's condition, pulse oximetry, ordering and review of radiographic studies, ordering and review of laboratory studies and ordering and performing treatments and interventions   Care discussed with: admitting provider     (including critical care time) Medical Decision Making / ED Course   Medical Decision Making Amount and/or Complexity of Data Reviewed Labs: ordered. Decision-making details documented in ED Course. Radiology: ordered and independent interpretation performed. Decision-making details documented in ED Course. ECG/medicine tests: ordered and independent interpretation performed. Decision-making details documented in ED Course.  Risk OTC drugs. Prescription drug management. Parenteral controlled substances. Decision regarding hospitalization.    Abdominal pain, nausea, vomiting differential diagnosis considered  CBC with leukocytosis.  No anemia.  CMP with mild hypokalemia.  Mild hyperglycemia without DKA.  No renal insufficiency.  No evidence of bili obstruction.  Lipase 225 concerning for possible pancreatitis.  CT scan negative for pancreatic inflammation.  It did reveal  proximal stomach and jejunum fold thickening suspicious for gastroenteritis.  No other acute findings noted.  Patient provided with IV fluids, pain medicine and several rounds of antiemetics.  Still having nausea vomiting. QTc  too long for droperidol /Haldol use.  Valium  use.  Will require admission for intractable nausea vomiting.   Clinical Course as of 06/01/24 0713  Mon Jun 01, 2024  0713 Consulted and spoke with Dr. Celinda from the hospitalist service who agreed to admit patient for further workup and management.  He requested getting a magnesium  level and replating potassium. [PC]    Clinical Course User Index [PC] Forever Arechiga, Raynell Moder, MD    Final Clinical Impression(s) / ED Diagnoses Final diagnoses:  Intractable vomiting   This chart was dictated using voice recognition software.  Despite best efforts to proofread,  errors can occur which can change the documentation meaning.    Trine Raynell Moder, MD 06/01/24 [phone]

## 2024-06-01 NOTE — ED Notes (Signed)
 Pt ambulated independently with steady gait to restroom to obtain urine specimen.

## 2024-06-01 NOTE — H&P (Signed)
 History and Physical    Patient: Kenneth Keller FMW:[id number] DOB: August 01, 2006 DOA: 06/01/2024 DOS: the patient was seen and examined on 06/01/2024 PCP: Dorothyann Ped, MD  Patient coming from: Transfer from [facility]  Chief Complaint:  Chief Complaint  Patient presents with   Emesis   HPI: Kenneth Keller is a 18 y.o. male with medical history significant of ADHD, kidney stones, psychiatric history including anxiety, depression, and prior history of self mutilation presents with persistent nausea, vomiting, and abdominal pain.  The symptoms of nausea, vomiting, and abdominal pain began yesterday.  The abdominal pain is significant, particularly in the lower abdomen, and he feels his abdominal muscles are fatigued from repeated vomiting.  Emesis was stomach contents.  Denies eating anything out of the norm to onset symptoms.  He has been unable to keep food down in a long time.  He experienced a severe, generalized headache described as 'splitting,' which resolved before the other symptoms. This episode is not the first occurrence; similar symptoms have happened before, with the last episode occurring approximately 1 month ago.  He has felt more constipated recently, with a small bowel movement occurring about two hours prior to being seen today. He has had no recent changes in his medication regimen.  Social history reveals recent marijuana use, with the last [place]  In the ED patient was noted to be afebrile with stable vital signs.  Labs significant for WBC 17.7, potassium 3.1, CO2 21, BUN 17, creatinine 1.08, anion gap 19, and lipase 225.  CT scan of the abdomen and pelvis have been obtained which noted thickened folds of the proximal stomach and jejunum consistent with gastroenteritis, small hiatal hernia, small umbilical fat hernia, and no findings concerning for pancreatitis.  Urinalysis positive for ketones.  Patient had been given Zofran , 2 L  of normal saline IV fluids, Protonix  40 mg IV, Reglan  10 mg IV, Valium  2.5 mg IV, Levsin , ketorolac  IV, and Dilaudid  IV.   Review of Systems: As mentioned in the history of present illness. All other systems reviewed and are negative. Past Medical History:  Diagnosis Date   ADHD    Anxiety    Kidney stones    MDD (major depressive disorder)    Self-mutilation    Past Surgical History:  Procedure Laterality Date   CLOSED REDUCTION FIBULA Right 01/28/2017   Procedure: CLOSED REDUCTION TIBIA/FIBULA  WITH CASTING;  Surgeon: Ozell Bruch, MD;  Location: [facility] OR;  Service: Orthopedics;  Laterality: Right;   CLOSED REDUCTION FINGER WITH PERCUTANEOUS PINNING Right 05/03/2022   Procedure: CLOSED REDUCTION PERCUTANEOUS PINNING RIGHT SMALL METACARPAL FRACTURE AND RIGHT RING FINGER METACARPAL;  Surgeon: Murrell Drivers, MD;  Location: [facility];  Service: Orthopedics;  Laterality: Right;   CLOSED REDUCTION METACARPAL WITH PERCUTANEOUS PINNING Left 03/24/2021   Procedure: CLOSED REDUCTION METACARPAL WITH PERCUTANEOUS PINNING;  Surgeon: Murrell Drivers, MD;  Location: [facility];  Service: Orthopedics;  Laterality: Left;   CLOSED REDUCTION RADIAL SHAFT Right 02/12/2021   Procedure: CLOSED REDUCTION RADIAL DISTAL FRACTURE,;  Surgeon: Murrell Drivers, MD;  Location: [facility] OR;  Service: Orthopedics;  Laterality: Right;   Social History:  reports that he has never smoked. He has never used smokeless tobacco. He reports current alcohol use. He reports current drug use. Drug: Marijuana.  Allergies  Allergen Reactions   Other Other (See Comments)    Pet dander and seasonal allergies (exposure results in allergic symptoms)    Family  History  Problem Relation Age of Onset   Heart failure Mother    Heart failure Father    Hypertension Father     Prior to Admission medications   Medication Sig Start Date End Date Taking? Authorizing Provider  cetirizine (ZYRTEC) 10 MG tablet Take 10 mg  by mouth daily.    [provider]  ondansetron  (ZOFRAN -ODT) 4 MG disintegrating tablet Take 1 tablet (4 mg total) by mouth every 8 (eight) hours as needed. 04/20/24   Horton, Charmaine FALCON, MD  sucralfate  (CARAFATE ) 1 g tablet Take 1 tablet (1 g total) by mouth 4 (four) times daily -  with meals and at bedtime. 04/20/24   Horton, Charmaine FALCON, MD  viloxazine ER (QELBREE) 200 MG 24 hr capsule Take 1 capsule (200 mg total) by mouth daily. 04/19/24   Mardy Elveria DEL, NP    Physical Exam: Vitals:   06/01/24 0130 06/01/24 0511 06/01/24 0630 06/01/24 0900  BP: 129/86 105/71 123/68 125/73  Pulse: 80 83 81 75  Resp: 18 16 20 19   Temp:    (!) 97.5 F (36.4 C)  TempSrc:    Oral  SpO2: 100% 100% 100% 100%  Weight:      Height:        Constitutional: Young male currently in no acute distress Eyes: PERRL, lids and conjunctivae normal ENMT: Mucous membranes are moist.  Normal dentition.  Neck: normal, supple  Respiratory: clear to auscultation bilaterally, no wheezing, no crackles. Normal respiratory effort. No accessory muscle use.  Cardiovascular: Regular rate and rhythm, no murmurs / rubs / gallops. No extremity edema. 2+ pedal pulses. No carotid bruits.  Abdomen: no tenderness, no masses palpated. No hepatosplenomegaly. Bowel sounds positive.  Musculoskeletal: no clubbing / cyanosis. No joint deformity upper and lower extremities. Good ROM, no contractures. Normal muscle tone.  Skin: no rashes, lesions, ulcers. No induration Neurologic: CN 2-12 grossly intact. Sensation intact, DTR normal. Strength 5/5 in all 4.  Psychiatric: Normal judgment and insight. Alert and oriented x 3. Normal mood.   Data Reviewed:   EKG revealed a sinus rhythm at 67 bpm with first-degree heart block and QTc prolonged at 556.  Reviewed labs, imaging, and pertinent records as documented.  Assessment and Plan:  Intractable nausea and vomiting Gastroenteritis Patient presents with nausea, vomiting, and  abdominal pain.  Lab has had been noted to be elevated at 225.  CT scan of the abdomen and pelvis notedproximal stomach and jejunum consistent with gastroenteritis, small hiatal hernia, small umbilical fat hernia, and no findings concerning for pancreatitis.  Urinalysis was positive for ketones, but did not show any signs for infection.  Question the possibility of gastroenteritis versus cannabinoid hyperemesis syndrome given history of marijuana use.  Patient had been given several medications including benzodiazepines and opiates for pain. - Admit to medical telemetry bed - Aspiration precautions with elevation head of bed - Check urine drug screen (UDS positive for benzodiazepines, opiates, and marijuana) - Clear liquid diet and advance as tolerated - IV fluids - Protonix  IV - Tigan  as needed for nausea and vomiting due to prolonged QT  Leukocytosis Acute.  WBC elevated at 17.7.  Likely reactive to above. - Recheck CBC tomorrow morning  Hypokalemia Acute.  Initial potassium noted to be 3.1.  Likely secondary to nausea and vomiting. - Normal saline IV fluids with 40 meq of potassium chloride  IV - Recheck potassium levels in a.m.  Prolonged QT interval  Acute.  QTc noted to be prolonged at 556. -  Avoid QT prolonging medications - Correct electrolyte abnormalities - Recheck QTc in a.m.  Metabolic acidosis with elevated anion gap Initial CO2 noted to be 21 with anion gap of 19. - Continue to monitor  Mood disorder - Continue home medication of olanzapine -samidorphan if able to supply from home  ADHD - Resume home medication regimen in the outpatient setting  Marijuana abuse Patient still reports smoking marijuana with last use reported to be approximately 2 days ago.  DVT prophylaxis: Advance Care Planning:   Code Status: Full Code    Consults: None  Family Communication: None  Severity of Illness: The appropriate patient status for this patient is OBSERVATION. Observation  status is judged to be reasonable and necessary in order to provide the required intensity of service to ensure the patient's safety. The patient's presenting symptoms, physical exam findings, and initial radiographic and laboratory data in the context of their medical condition is felt to place them at decreased risk for further clinical deterioration. Furthermore, it is anticipated that the patient will be medically stable for discharge from the hospital within 2 midnights of admission.   Author: Maximino DELENA Sharps, MD 06/01/2024 9:08 AM  For on call review [email or web address].

## 2024-06-01 NOTE — ED Notes (Signed)
 Patient transported to CT

## 2024-06-01 NOTE — ED Notes (Signed)
 Called Kenneth Keller at [facility] for transport

## 2024-06-02 DIAGNOSIS — F12188 Cannabis abuse with other cannabis-induced disorder: Secondary | ICD-10-CM | POA: Diagnosis not present

## 2024-06-02 DIAGNOSIS — K529 Noninfective gastroenteritis and colitis, unspecified: Secondary | ICD-10-CM | POA: Diagnosis present

## 2024-06-02 DIAGNOSIS — K449 Diaphragmatic hernia without obstruction or gangrene: Secondary | ICD-10-CM | POA: Diagnosis present

## 2024-06-02 DIAGNOSIS — R112 Nausea with vomiting, unspecified: Secondary | ICD-10-CM | POA: Diagnosis present

## 2024-06-02 DIAGNOSIS — E876 Hypokalemia: Secondary | ICD-10-CM | POA: Diagnosis present

## 2024-06-02 DIAGNOSIS — E872 Acidosis, unspecified: Secondary | ICD-10-CM | POA: Diagnosis present

## 2024-06-02 DIAGNOSIS — R9431 Abnormal electrocardiogram [ECG] [EKG]: Secondary | ICD-10-CM | POA: Diagnosis present

## 2024-06-02 DIAGNOSIS — R519 Headache, unspecified: Secondary | ICD-10-CM | POA: Diagnosis present

## 2024-06-02 DIAGNOSIS — F1210 Cannabis abuse, uncomplicated: Secondary | ICD-10-CM | POA: Diagnosis present

## 2024-06-02 DIAGNOSIS — F329 Major depressive disorder, single episode, unspecified: Secondary | ICD-10-CM | POA: Diagnosis present

## 2024-06-02 DIAGNOSIS — Z9152 Personal history of nonsuicidal self-harm: Secondary | ICD-10-CM | POA: Diagnosis not present

## 2024-06-02 DIAGNOSIS — R1110 Vomiting, unspecified: Secondary | ICD-10-CM | POA: Diagnosis present

## 2024-06-02 DIAGNOSIS — F909 Attention-deficit hyperactivity disorder, unspecified type: Secondary | ICD-10-CM | POA: Diagnosis present

## 2024-06-02 DIAGNOSIS — F39 Unspecified mood [affective] disorder: Secondary | ICD-10-CM | POA: Diagnosis present

## 2024-06-02 DIAGNOSIS — F1219 Cannabis abuse with unspecified cannabis-induced disorder: Secondary | ICD-10-CM | POA: Diagnosis present

## 2024-06-02 DIAGNOSIS — F419 Anxiety disorder, unspecified: Secondary | ICD-10-CM | POA: Diagnosis present

## 2024-06-02 DIAGNOSIS — Z8249 Family history of ischemic heart disease and other diseases of the circulatory system: Secondary | ICD-10-CM | POA: Diagnosis not present

## 2024-06-02 LAB — COMPREHENSIVE METABOLIC PANEL WITH GFR
ALT: 24 U/L (ref 0–44)
AST: 63 U/L — ABNORMAL HIGH (ref 15–41)
Albumin: 4 g/dL (ref 3.5–5.0)
Alkaline Phosphatase: 41 U/L (ref 38–126)
Anion gap: 9 (ref 5–15)
BUN: <5 mg/dL — ABNORMAL LOW (ref 6–20)
CO2: 22 mmol/L (ref 22–32)
Calcium: 9 mg/dL (ref 8.9–10.3)
Chloride: 108 mmol/L (ref 98–111)
Creatinine, Ser: 0.86 mg/dL (ref 0.61–1.24)
GFR, Estimated: >60 mL/min (ref >60)
Glucose, Bld: 95 mg/dL (ref 70–99)
Potassium: 3.5 mmol/L (ref 3.5–5.1)
Sodium: 139 mmol/L (ref 135–145)
Total Bilirubin: 0.7 mg/dL (ref 0.0–1.2)
Total Protein: 6.2 g/dL — ABNORMAL LOW (ref 6.5–8.1)

## 2024-06-02 LAB — CBC
HCT: 44 % (ref 39.0–52.0)
Hemoglobin: 14.8 g/dL (ref 13.0–17.0)
MCH: 29.1 pg (ref 26.0–34.0)
MCHC: 33.6 g/dL (ref 30.0–36.0)
MCV: 86.4 fL (ref 80.0–100.0)
Platelets: 265 K/uL (ref 150–400)
RBC: 5.09 MIL/uL (ref 4.22–5.81)
RDW: 11.8 % (ref 11.5–15.5)
WBC: 7.4 K/uL (ref 4.0–10.5)
nRBC: 0 % (ref 0.0–0.2)

## 2024-06-02 LAB — MAGNESIUM: Magnesium: 2.2 mg/dL (ref 1.7–2.4)

## 2024-06-02 LAB — PHOSPHORUS: Phosphorus: 2.5 mg/dL (ref 2.5–4.6)

## 2024-06-02 MED ORDER — SODIUM CHLORIDE 0.9 % IV SOLN: INTRAVENOUS | Status: DC

## 2024-06-02 MED ORDER — HYDROMORPHONE HCL 1 MG/ML IJ SOLN
0.5000 mg | Freq: Once | INTRAMUSCULAR | Status: AC
  Administered 2024-06-02: 0.5 mg via INTRAVENOUS
  Filled 2024-06-02: qty 1

## 2024-06-02 MED ORDER — PROMETHAZINE (PHENERGAN) 6.25MG IN NS 50ML IVPB
6.2500 mg | Freq: Once | INTRAVENOUS | Status: AC
  Administered 2024-06-02: 6.25 mg via INTRAVENOUS
  Filled 2024-06-02: qty 6.25

## 2024-06-02 MED ORDER — MORPHINE SULFATE (PF) 2 MG/ML IV SOLN
2.0000 mg | INTRAVENOUS | Status: DC | PRN
  Administered 2024-06-02 – 2024-06-03 (×5): 2 mg via INTRAVENOUS
  Filled 2024-06-02 (×5): qty 1

## 2024-06-02 MED ORDER — PROCHLORPERAZINE EDISYLATE 10 MG/2ML IJ SOLN
10.0000 mg | Freq: Once | INTRAMUSCULAR | Status: AC
  Administered 2024-06-02: 10 mg via INTRAVENOUS
  Filled 2024-06-02: qty 2

## 2024-06-02 NOTE — Progress Notes (Signed)
 Transition of Care [facility]) - Inpatient Brief Assessment   Patient Details  Name: Kenneth Keller MRN: [id number] Date of Birth: [birth year]  Transition of Care [facility]) CM/SW Contact:    Rosaline JONELLE Joe, RN Phone Number: 06/02/2024, 10:05 AM   Clinical Narrative: Patient transferred from [facility] with nausea, vomiting and abdominal pain - plan for clear liquids, antiemetics, IVF and protonix .   Substance abuse counseling added to patient AVS since patient with active use of marijuana use.  No other IP Care Management needs at this time.  CM will continue to follow the patient for needs as he progresses.   Transition of Care Asessment: Insurance and Status: (P) Insurance coverage has been reviewed Patient has primary care physician: (P) Yes Home environment has been reviewed: (P) from home Prior level of function:: (P) Independent Prior/Current Home Services: (P) No current home services Social Drivers of Health Review: (P) SDOH reviewed no interventions necessary Readmission risk has been reviewed: (P) Yes Transition of care needs: (P) no transition of care needs at this time

## 2024-06-02 NOTE — Plan of Care (Signed)

## 2024-06-02 NOTE — Progress Notes (Signed)
 [facility] Progress Note  Patient: Kenneth Keller    FMW:[id number]  DOA: 06/01/2024     Date of Service: the patient was seen and examined on 06/02/2024  Chief Complaint  Patient presents with   Emesis   Brief hospital course: Kenneth Keller is a 18 y.o. male with medical history significant of ADHD, kidney stones, psychiatric history including anxiety, depression, and prior history of self mutilation presents with persistent nausea, vomiting, and abdominal pain.   ED workup: VS tachycardic BMP: Hypokalemia potassium 3.1, CO2 21, glucose 136, anion gap 19  lipase 225  CT a/p: Thickened folds of the proximal stomach and jejunum consistent with gastroenteritis, small hiatal hernia, small umbilical fat hernia, and no findings concerning for pancreatitis.   UDS: Positive for benzo, opiates and THC  Assessment and Plan:   Intractable nausea and vomiting Gastroenteritis Possible viral gastroenteritis, cannabinoid hyperemesis syndrome due to history of marijuana use versus pancreatitis, elevated lipase Continue IV fluid for hydration Symptomatic treatment for pain and nausea vomiting Full liquid diet Trend lipase Advance diet gradually as per tolerance   Hypokalemia, potassium repleted.  Resolved. Monitor electrolytes.   Leukocytosis most likely reactive.  Resolved   Prolonged QT interval  Acute.  QTc noted to be prolonged at 556. - Avoid QT prolonging medications - Correct electrolyte abnormalities - Recheck QTc in a.m.   Metabolic acidosis with elevated anion gap Initial CO2 noted to be 21 with anion gap of 19. - Continue to monitor   Mood disorder - Continue home medication of olanzapine -samidorphan if able to supply from home   ADHD - Resume home medication regimen in the outpatient setting   Marijuana abuse Patient still reports smoking marijuana with last use reported to be approximately 2 days ago. Drug abuse abstinence counseling done  Body mass index  is 23.3 kg/m.  Interventions:  Diet: FLD DVT Prophylaxis: Subcutaneous Lovenox    Advance goals of care discussion: Full code  Family Communication: family was present at bedside, at the time of interview.  The pt provided permission to discuss medical plan with the family. Opportunity was given to ask question and all questions were answered satisfactorily.   Disposition:  Pt is from home, admitted with intractable abdominal pain, nausea vomiting, still on full liquid diet, which precludes a safe discharge. Discharge to home, when stable, may discharge tomorrow a.m.  Subjective: No significant events overnight, in the morning time patient was having significant abdominal pain and persistent vomiting which resolved after symptomatic treatment given. During my rounds patient was sitting comfortably, denied any abdominal pain, no nausea vomiting.  Physical Exam: General: NAD, lying comfortably Appear in no distress, affect appropriate Eyes: PERRLA ENT: Oral Mucosa Clear, moist  Neck: no JVD,  Cardiovascular: S1 and S2 Present, no Murmur,  Respiratory: good respiratory effort, Bilateral Air entry equal and Decreased, no Crackles, no wheezes Abdomen: Bowel Sound present, Soft and no tenderness,  Skin: no rashes Extremities: no Pedal edema, no calf tenderness Neurologic: without any new focal findings Gait not checked due to patient safety concerns  Vitals:   06/01/24 1624 06/01/24 2346 06/02/24 0500 06/02/24 1210  BP: 125/73 126/77 99/84 121/75  Pulse: (!) 111 67 95 65  Resp: 19 18 18 17   Temp: 98.5 F (36.9 C) 98 F (36.7 C) 98.1 F (36.7 C) 98.2 F (36.8 C)  TempSrc:  Oral Oral Oral  SpO2: 98% 98% 98% 100%  Weight:      Height:        Intake/Output Summary (  Last 24 hours) at 06/02/2024 1629 Last data filed at 06/02/2024 9062 Gross per 24 hour  Intake 3 ml  Output 650 ml  Net -647 ml   Filed Weights   06/01/24 0125  Weight: 63.5 kg    Data Reviewed: I have  personally reviewed and interpreted daily labs, tele strips, imagings as discussed above. I reviewed all nursing notes, pharmacy notes, vitals, pertinent old records I have discussed plan of care as described above with RN and patient/family.  CBC: Recent Labs  Lab 06/01/24 0225 06/02/24 0429  WBC 17.7* 7.4  NEUTROABS 15.0*  --   HGB 15.7 14.8  HCT 44.4 44.0  MCV 82.8 86.4  PLT 304 265   Basic Metabolic Panel: Recent Labs  Lab 06/01/24 0225 06/01/24 0714 06/02/24 0429  NA 142  --  139  K 3.1*  --  3.5  CL 103  --  108  CO2 21*  --  22  GLUCOSE 136*  --  95  BUN 17  --  <5*  CREATININE 1.08  --  0.86  CALCIUM 10.2  --  9.0  MG  --  1.9 2.2  PHOS  --   --  2.5    Studies: No results found.  Scheduled Meds:  enoxaparin  (LOVENOX ) injection  40 mg Subcutaneous Q24H   pantoprazole  (PROTONIX ) IV  40 mg Intravenous Q12H   sodium chloride  flush  3 mL Intravenous Q12H   Continuous Infusions:  sodium chloride  75 mL/hr at 06/02/24 0855   PRN Meds: acetaminophen  **OR** acetaminophen , morphine  injection, trimethobenzamide   Time spent: 55 minutes  Author: ELVAN Keller. MD [facility] 06/02/2024 4:29 PM  To reach On-call, see care teams to locate the attending and reach out to them via [email or web address]. If 7PM-7AM, please contact night-coverage If you still have difficulty reaching the attending provider, please page the [facility] (Director on Call) for [facility] on amion for assistance.

## 2024-06-03 DIAGNOSIS — F12188 Cannabis abuse with other cannabis-induced disorder: Secondary | ICD-10-CM

## 2024-06-03 LAB — CBC
HCT: 45.8 % (ref 39.0–52.0)
Hemoglobin: 16 g/dL (ref 13.0–17.0)
MCH: 29.5 pg (ref 26.0–34.0)
MCHC: 34.9 g/dL (ref 30.0–36.0)
MCV: 84.3 fL (ref 80.0–100.0)
Platelets: 277 K/uL (ref 150–400)
RBC: 5.43 MIL/uL (ref 4.22–5.81)
RDW: 11.5 % (ref 11.5–15.5)
WBC: 10.3 K/uL (ref 4.0–10.5)
nRBC: 0 % (ref 0.0–0.2)

## 2024-06-03 LAB — BASIC METABOLIC PANEL WITH GFR
Anion gap: 12 (ref 5–15)
BUN: <5 mg/dL — ABNORMAL LOW (ref 6–20)
CO2: 22 mmol/L (ref 22–32)
Calcium: 9.2 mg/dL (ref 8.9–10.3)
Chloride: 105 mmol/L (ref 98–111)
Creatinine, Ser: 0.91 mg/dL (ref 0.61–1.24)
GFR, Estimated: >60 mL/min (ref >60)
Glucose, Bld: 98 mg/dL (ref 70–99)
Potassium: 3.1 mmol/L — ABNORMAL LOW (ref 3.5–5.1)
Sodium: 139 mmol/L (ref 135–145)

## 2024-06-03 LAB — MAGNESIUM: Magnesium: 2 mg/dL (ref 1.7–2.4)

## 2024-06-03 LAB — LIPASE, BLOOD: Lipase: 40 U/L (ref 11–51)

## 2024-06-03 LAB — PHOSPHORUS: Phosphorus: 3 mg/dL (ref 2.5–4.6)

## 2024-06-03 MED ORDER — ONDANSETRON 4 MG PO TBDP: 4.0000 mg | ORAL_TABLET | Freq: Four times a day (QID) | ORAL | 0 refills | Status: AC | PRN

## 2024-06-03 MED ORDER — PANTOPRAZOLE SODIUM 40 MG PO TBEC: 40.0000 mg | DELAYED_RELEASE_TABLET | Freq: Every day | ORAL | 0 refills | Status: DC

## 2024-06-03 MED ORDER — PROMETHAZINE HCL 25 MG RE SUPP: 25.0000 mg | Freq: Four times a day (QID) | RECTAL | 0 refills | Status: DC | PRN

## 2024-06-03 MED ORDER — SUCRALFATE 1 G PO TABS: 1.0000 g | ORAL_TABLET | Freq: Three times a day (TID) | ORAL | 0 refills | Status: DC

## 2024-06-03 MED ORDER — POTASSIUM CHLORIDE CRYS ER 20 MEQ PO TBCR
40.0000 meq | EXTENDED_RELEASE_TABLET | Freq: Once | ORAL | Status: AC
  Administered 2024-06-03: 40 meq via ORAL
  Filled 2024-06-03: qty 2

## 2024-06-03 NOTE — Assessment & Plan Note (Addendum)
 06-03-2024 resolved. WBC 10.3 today.

## 2024-06-03 NOTE — Assessment & Plan Note (Addendum)
 06-03-2024 stable.

## 2024-06-03 NOTE — Subjective & Objective (Signed)
 Pt seen and examined. Admits to smoking marijuana nearly every day. N/V improved. Able to tolerate some liquids. Hot showers help tremendously with his N/V.

## 2024-06-03 NOTE — Assessment & Plan Note (Addendum)
 06-03-2024 resolved with Ivf. Serum CO2 is 22 today.

## 2024-06-03 NOTE — Assessment & Plan Note (Addendum)
 06-03-2024 give po kcl today.

## 2024-06-03 NOTE — Assessment & Plan Note (Signed)
 06-03-2024 advance to soft GI diet. If he can tolerate soft diet he can go home. Suggested he take a hot shower prior to eating any food or drinking any liquids.

## 2024-06-03 NOTE — Discharge Summary (Addendum)
 [facility] Physician Discharge Summary   Patient name: Kenneth Keller  Admit date:     06/01/2024  Discharge date: 06/03/2024  Attending Physician: VON BELLIS [[id number]]  Discharge Physician: Camellia Door   PCP: Dorothyann Ped, MD  Admitted From: Home  Disposition:  Home  Recommendations for Outpatient Follow-up:  Follow up with PCP in 1-2 weeks Stop using marijuana or CBG products  Home Health:No Equipment/Devices: None    Discharge Condition:Stable CODE STATUS:FULL Diet recommendation: Heart Healthy Fluid Restriction: None  Hospital Summary: HPI: Kenneth Keller is a 18 y.o. male with medical history significant of ADHD, kidney stones, psychiatric history including anxiety, depression, and prior history of self mutilation presents with persistent nausea, vomiting, and abdominal pain.   The symptoms of nausea, vomiting, and abdominal pain began yesterday.  The abdominal pain is significant, particularly in the lower abdomen, and he feels his abdominal muscles are fatigued from repeated vomiting.  Emesis was stomach contents.  Denies eating anything out of the norm to onset symptoms.  He has been unable to keep food down in a long time.   He experienced a severe, generalized headache described as 'splitting,' which resolved before the other symptoms. This episode is not the first occurrence; similar symptoms have happened before, with the last episode occurring approximately 1 month ago.   He has felt more constipated recently, with a small bowel movement occurring about two hours prior to being seen today. He has had no recent changes in his medication regimen.   Social history reveals recent marijuana use, with the last use [place]   In the ED patient was noted to be afebrile with stable vital signs.  Labs significant for WBC 17.7, potassium 3.1, CO2 21, BUN 17, creatinine 1.08, anion gap 19, and lipase 225.  CT scan of the  abdomen and pelvis have been obtained which noted thickened folds of the proximal stomach and jejunum consistent with gastroenteritis, small hiatal hernia, small umbilical fat hernia, and no findings concerning for pancreatitis.  Urinalysis positive for ketones.  Patient had been given Zofran , 2 L of normal saline IV fluids, Protonix  40 mg IV, Reglan  10 mg IV, Valium  2.5 mg IV, Levsin , ketorolac  IV, and Dilaudid  IV.  Significant Events: Admitted 06/01/2024 intractable N/V   Admission Labs: Na 142, K 3.1, CO2 of 21, BUN 17, Scr 1.08, glu 136 T prot 7.7, alb 5.3, AST 38, ALT 24, alk phos 59, t. Bili 0.3 Lipase 225  Admission Imaging Studies:   Significant Labs: UDS positive for opiates, benzo, THC  Significant Imaging Studies:   Antibiotic Therapy: Anti-infectives (From admission, onward)    None       Procedures:   Consultants:    Hospital Course by Problem: * Cannabis hyperemesis syndrome concurrent with and due to cannabis abuse (HCC) 06-03-2024 advance to soft GI diet. If he can tolerate soft diet he can go home. Suggested he take a hot shower prior to eating any food or drinking any liquids.  Metabolic acidosis with increased anion gap and accumulation of organic acids 06-03-2024 resolved with Ivf. Serum CO2 is 22 today.  Anxiety and depression 06-03-2024 stable.  Leukocytosis 06-03-2024 resolved. WBC 10.3 today.  Prolonged QT interval 06-03-2024 resolved. With improvement in serum K    Hypokalemia 06-03-2024 give po kcl today.  ADHD 06-03-2024 stable.    Discharge Diagnoses:  Principal Problem:   Cannabis hyperemesis syndrome concurrent with and due to cannabis abuse [facility] Active Problems:  Metabolic acidosis with increased anion gap and accumulation of organic acids   ADHD   Hypokalemia   Prolonged QT interval   Leukocytosis   Anxiety and depression   Discharge Instructions  Discharge Instructions     Call MD for:  difficulty  breathing, headache or visual disturbances   Complete by: As directed    Call MD for:  extreme fatigue   Complete by: As directed    Call MD for:  hives   Complete by: As directed    Call MD for:  persistant dizziness or light-headedness   Complete by: As directed    Call MD for:  persistant nausea and vomiting   Complete by: As directed    Call MD for:  redness, tenderness, or signs of infection (pain, swelling, redness, odor or green/yellow discharge around incision site)   Complete by: As directed    Call MD for:  severe uncontrolled pain   Complete by: As directed    Call MD for:  temperature >100.4   Complete by: As directed    Diet - low sodium heart healthy   Complete by: As directed    Discharge instructions   Complete by: As directed    1. Follow up with your primary care provider in 1-2 weeks following discharge from hospital. 2. Stop smoking or using marijuana or any products that contain CBD or THC.   Discharge patient   Complete by: As directed    Discharge disposition: 01-Home or Self Care   Discharge patient date: 06/03/2024   Increase activity slowly   Complete by: As directed       Allergies as of 06/03/2024       Reactions   Other Other (See Comments)   Pet dander and seasonal allergies (exposure results in allergic symptoms)        Medication List     TAKE these medications    cetirizine 10 MG tablet Commonly known as: ZYRTEC Take 10 mg by mouth daily as needed for allergies.   Lybalvi  10-10 MG Tabs Generic drug: OLANZapine -Samidorphan Take 1 tablet by mouth daily.   ondansetron  4 MG disintegrating tablet Commonly known as: ZOFRAN -ODT Take 1 tablet (4 mg total) by mouth every 6 (six) hours as needed for nausea or vomiting. What changed:  when to take this reasons to take this   pantoprazole  40 MG tablet Commonly known as: Protonix  Take 1 tablet (40 mg total) by mouth daily.   promethazine  25 MG suppository Commonly known as:  PHENERGAN  Place 1 suppository (25 mg total) rectally every 6 (six) hours as needed for nausea or vomiting (not responsive to po Zofran ).   sucralfate  1 g tablet Commonly known as: Carafate  Take 1 tablet (1 g total) by mouth 4 (four) times daily -  with meals and at bedtime.   viloxazine ER 200 MG 24 hr capsule Commonly known as: Qelbree Take 1 capsule (200 mg total) by mouth daily.        Allergies  Allergen Reactions   Other Other (See Comments)    Pet dander and seasonal allergies (exposure results in allergic symptoms)    Discharge Exam: Vitals:   06/03/24 1214 06/03/24 1618  BP: (!) 136/93 (!) 123/91  Pulse: 84 (!) 103  Resp:  20  Temp:  98.4 F (36.9 C)  SpO2: 99% 98%    Physical Exam Vitals and nursing note reviewed.  Constitutional:      General: He is not in acute distress.  Appearance: He is normal weight. He is not toxic-appearing or diaphoretic.  HENT:     Head: Normocephalic and atraumatic.     Nose: Nose normal.  Cardiovascular:     Rate and Rhythm: Normal rate and regular rhythm.  Pulmonary:     Effort: Pulmonary effort is normal.     Breath sounds: Normal breath sounds.  Abdominal:     General: Abdomen is flat. Bowel sounds are normal.     Palpations: Abdomen is soft.  Musculoskeletal:     Right lower leg: No edema.     Left lower leg: No edema.  Skin:    General: Skin is warm and dry.     Capillary Refill: Capillary refill takes less than 2 seconds.  Neurological:     General: No focal deficit present.     Mental Status: He is alert and oriented to person, place, and time.     The results of significant diagnostics from this hospitalization (including imaging, microbiology, ancillary and laboratory) are listed below for reference.     Labs:  Basic Metabolic Panel: Recent Labs  Lab 06/01/24 0225 06/01/24 0714 06/02/24 0429 06/03/24 0242  NA 142  --  139 139  K 3.1*  --  3.5 3.1*  CL 103  --  108 105  CO2 21*  --  22 22   GLUCOSE 136*  --  95 98  BUN 17  --  <5* <5*  CREATININE 1.08  --  0.86 0.91  CALCIUM 10.2  --  9.0 9.2  MG  --  1.9 2.2 2.0  PHOS  --   --  2.5 3.0   Liver Function Tests: Recent Labs  Lab 06/01/24 0225 06/02/24 0429  AST 38 63*  ALT 24 24  ALKPHOS 59 41  BILITOT 0.3 0.7  PROT 7.7 6.2*  ALBUMIN 5.3* 4.0   Recent Labs  Lab 06/01/24 0225 06/03/24 0242  LIPASE 225* 40   CBC: Recent Labs  Lab 06/01/24 0225 06/02/24 0429 06/03/24 0242  WBC 17.7* 7.4 10.3  NEUTROABS 15.0*  --   --   HGB 15.7 14.8 16.0  HCT 44.4 44.0 45.8  MCV 82.8 86.4 84.3  PLT 304 265 277   Urinalysis    Component Value Date/Time   COLORURINE YELLOW 06/01/2024 0918   APPEARANCEUR CLEAR 06/01/2024 0918   LABSPEC 1.028 06/01/2024 0918   PHURINE 6.0 06/01/2024 0918   GLUCOSEU NEGATIVE 06/01/2024 0918   HGBUR NEGATIVE 06/01/2024 0918   BILIRUBINUR NEGATIVE 06/01/2024 0918   KETONESUR 80 (A) 06/01/2024 0918   PROTEINUR NEGATIVE 06/01/2024 0918   NITRITE NEGATIVE 06/01/2024 0918   LEUKOCYTESUR NEGATIVE 06/01/2024 0918   Urine Drug Screen: Lab Results  Component Value Date/Time   LABOPIA POSITIVE (A) 06/01/2024 11:21 AM   COCAINSCRNUR NONE DETECTED 06/01/2024 11:21 AM   LABBENZ POSITIVE (A) 06/01/2024 11:21 AM   AMPHETMU NONE DETECTED 06/01/2024 11:21 AM   THCU POSITIVE (A) 06/01/2024 11:21 AM   LABBARB NONE DETECTED 06/01/2024 11:21 AM     Sepsis Labs Recent Labs  Lab 06/01/24 0225 06/02/24 0429 06/03/24 0242  WBC 17.7* 7.4 10.3    Procedures/Studies: CT ABDOMEN PELVIS W CONTRAST Result Date: 06/01/2024 CLINICAL DATA:  Abdominal pain. Pancreatitis suspected. Possible recurring cannabis related hyperemesis syndrome. Similar symptoms last month. EXAM: CT ABDOMEN AND PELVIS WITH CONTRAST TECHNIQUE: Multidetector CT imaging of the abdomen and pelvis was performed using the standard protocol following bolus administration of intravenous contrast. RADIATION DOSE REDUCTION: This exam was  performed according to the departmental dose-optimization program which includes automated exposure control, adjustment of the mA and/or kV according to patient size and/or use of iterative reconstruction technique. CONTRAST:  80mL OMNIPAQUE  IOHEXOL  300 MG/ML  SOLN COMPARISON:  CT with IV contrast 04/20/2024, CT with IV contrast 09/30/2019. FINDINGS: Lower chest: No acute abnormality. Clear lung bases. Normal cardiac size. Small hiatal hernia. Hepatobiliary: No abnormality. Pancreas: No abnormality. Specifically, no mass enhancement, ductal dilatation or peripancreatic inflammation. Spleen: No abnormality. Adrenals/Urinary Tract: No abnormality. Stomach/Bowel: There are thickened folds in the proximal stomach. There are thickened folds in the jejunum. Findings consistent with gastroenteritis. There is no small bowel dilatation or inflammatory change. There is a well visible normal appendix. The large intestine is unremarkable. Vascular/Lymphatic: No significant vascular findings are present. No enlarged abdominal or pelvic lymph nodes. Reproductive: Prostate is unremarkable. Other: Small umbilical fat hernia. No incarcerated hernia. No free fluid. Pelvic phleboliths. Musculoskeletal: No acute or significant osseous findings. IMPRESSION: 1. Thickened folds in the proximal stomach and jejunum consistent with gastroenteritis. No mesenteric inflammatory change. 2. Small hiatal hernia.  Small umbilical fat hernia. 3. No CT findings of pancreatitis. Electronically Signed   By: Francis Quam M.D.   On: 06/01/2024 03:50    Time coordinating discharge: 55 mins  SIGNED:  Camellia Door, DO [facility] 06/03/24, 4:27 PM

## 2024-06-03 NOTE — Hospital Course (Signed)
 HPI: Kenneth Keller is a 18 y.o. male with medical history significant of ADHD, kidney stones, psychiatric history including anxiety, depression, and prior history of self mutilation presents with persistent nausea, vomiting, and abdominal pain.   The symptoms of nausea, vomiting, and abdominal pain began yesterday.  The abdominal pain is significant, particularly in the lower abdomen, and he feels his abdominal muscles are fatigued from repeated vomiting.  Emesis was stomach contents.  Denies eating anything out of the norm to onset symptoms.  He has been unable to keep food down in a long time.   He experienced a severe, generalized headache described as 'splitting,' which resolved before the other symptoms. This episode is not the first occurrence; similar symptoms have happened before, with the last episode occurring approximately 1 month ago.   He has felt more constipated recently, with a small bowel movement occurring about two hours prior to being seen today. He has had no recent changes in his medication regimen.   Social history reveals recent marijuana use, with the last use [place]   In the ED patient was noted to be afebrile with stable vital signs.  Labs significant for WBC 17.7, potassium 3.1, CO2 21, BUN 17, creatinine 1.08, anion gap 19, and lipase 225.  CT scan of the abdomen and pelvis have been obtained which noted thickened folds of the proximal stomach and jejunum consistent with gastroenteritis, small hiatal hernia, small umbilical fat hernia, and no findings concerning for pancreatitis.  Urinalysis positive for ketones.  Patient had been given Zofran , 2 L of normal saline IV fluids, Protonix  40 mg IV, Reglan  10 mg IV, Valium  2.5 mg IV, Levsin , ketorolac  IV, and Dilaudid  IV.  Significant Events: Admitted 06/01/2024 intractable N/V   Admission Labs: Na 142, K 3.1, CO2 of 21, BUN 17, Scr 1.08, glu 136 T prot 7.7, alb 5.3, AST 38, ALT 24,  alk phos 59, t. Bili 0.3 Lipase 225  Admission Imaging Studies:   Significant Labs: UDS positive for opiates, benzo, THC  Significant Imaging Studies:   Antibiotic Therapy: Anti-infectives (From admission, onward)    None       Procedures:   Consultants:

## 2024-06-03 NOTE — Assessment & Plan Note (Addendum)
 06-03-2024 resolved. With improvement in serum K

## 2024-06-03 NOTE — Progress Notes (Signed)
 PROGRESS NOTE    Kenneth Keller  FMW:[id number] DOB: Aug 08, 2006 DOA: 06/01/2024 PCP: Kenneth Ped, MD  Subjective: Pt seen and examined. Admits to smoking marijuana nearly every day. N/V improved. Able to tolerate some liquids. Hot showers help tremendously with his N/V.   Hospital Course: HPI: Kenneth Keller is a 18 y.o. male with medical history significant of ADHD, kidney stones, psychiatric history including anxiety, depression, and prior history of self mutilation presents with persistent nausea, vomiting, and abdominal pain.   The symptoms of nausea, vomiting, and abdominal pain began yesterday.  The abdominal pain is significant, particularly in the lower abdomen, and he feels his abdominal muscles are fatigued from repeated vomiting.  Emesis was stomach contents.  Denies eating anything out of the norm to onset symptoms.  He has been unable to keep food down in a long time.   He experienced a severe, generalized headache described as 'splitting,' which resolved before the other symptoms. This episode is not the first occurrence; similar symptoms have happened before, with the last episode occurring approximately 1 month ago.   He has felt more constipated recently, with a small bowel movement occurring about two hours prior to being seen today. He has had no recent changes in his medication regimen.   Social history reveals recent marijuana use, with the last [place]   In the ED patient was noted to be afebrile with stable vital signs.  Labs significant for WBC 17.7, potassium 3.1, CO2 21, BUN 17, creatinine 1.08, anion gap 19, and lipase 225.  CT scan of the abdomen and pelvis have been obtained which noted thickened folds of the proximal stomach and jejunum consistent with gastroenteritis, small hiatal hernia, small umbilical fat hernia, and no findings concerning for pancreatitis.  Urinalysis positive for ketones.  Patient had been given  Zofran , 2 L of normal saline IV fluids, Protonix  40 mg IV, Reglan  10 mg IV, Valium  2.5 mg IV, Levsin , ketorolac  IV, and Dilaudid  IV.  Significant Events: Admitted 06/01/2024 intractable N/V   Admission Labs: Na 142, K 3.1, CO2 of 21, BUN 17, Scr 1.08, glu 136 T prot 7.7, alb 5.3, AST 38, ALT 24, alk phos 59, t. 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 If he can tolerate soft diet he can go home. Suggested he take a hot shower prior to eating any food or drinking any liquids.  Metabolic acidosis with increased anion gap and accumulation of organic acids 06-03-2024 resolved with Ivf. Serum CO2 is 22 today.  Anxiety and depression 06-03-2024 stable.  Leukocytosis 06-03-2024 resolved. WBC 10.3 today.  Prolonged QT interval 06-03-2024 resolved. With improvement in serum K    Hypokalemia 06-03-2024 give po kcl today.  ADHD 06-03-2024 stable.   DVT prophylaxis: enoxaparin  (LOVENOX ) injection 40 mg Start: 06/01/24 1015    Code Status: Full Code Family Communication: pt is decisional. No family at bedside Disposition Plan: home Reason for continuing need for hospitalization: stable for DC.  Objective: Vitals:   06/03/24 0002 06/03/24 0440 06/03/24 0753 06/03/24 1214  BP: 125/70 131/87 120/77 (!) 136/93  Pulse: 66 95  65 84  Resp: 20 20 18    Temp: 98.7 F (37.1 C) 98.9 F (37.2 C) 98.6 F (37 C)   TempSrc:      SpO2: 98% 100% 98% 99%  Weight:      Height:        Intake/Output Summary (Last 24 hours) at 06/03/2024 1219 Last data filed at 06/03/2024 9271 Gross per 24 hour  Intake 1426.99 ml  Output 200 ml  Net  1226.99 ml   Filed Weights   06/01/24 0125  Weight: 63.5 kg    Examination:  Physical Exam Vitals and nursing note reviewed.  Constitutional:      General: He is not in acute distress.    Appearance: He is normal weight. He is not toxic-appearing or diaphoretic.  HENT:     Head: Normocephalic and atraumatic.     Nose: Nose normal.  Cardiovascular:     Rate and Rhythm: Normal rate and regular rhythm.  Pulmonary:     Effort: Pulmonary effort is normal.     Breath sounds: Normal breath sounds.  Abdominal:     General: Abdomen is flat. Bowel sounds are normal.     Palpations: Abdomen is soft.  Musculoskeletal:     Right lower leg: No edema.     Left lower leg: No edema.  Skin:    General: Skin is warm and dry.     Capillary Refill: Capillary refill takes less than 2 seconds.  Neurological:     General: No focal deficit present.     Mental Status: He is alert and oriented to person, place, and time.     Data Reviewed: I have personally reviewed following labs and imaging studies  CBC: Recent Labs  Lab 06/01/24 0225 06/02/24 0429 06/03/24 0242  WBC 17.7* 7.4 10.3  NEUTROABS 15.0*  --   --   HGB 15.7 14.8 16.0  HCT 44.4 44.0 45.8  MCV 82.8 86.4 84.3  PLT 304 265 277   Basic Metabolic Panel: Recent Labs  Lab 06/01/24 0225 06/01/24 0714 06/02/24 0429 06/03/24 0242  NA 142  --  139 139  K 3.1*  --  3.5 3.1*  CL 103  --  108 105  CO2 21*  --  22 22  GLUCOSE 136*  --  95 98  BUN 17  --  <5* <5*  CREATININE 1.08  --  0.86 0.91  CALCIUM 10.2  --  9.0 9.2  MG  --  1.9 2.2 2.0  PHOS  --   --  2.5 3.0   GFR: Estimated Creatinine Clearance: 113.6 mL/min (by C-G formula based on SCr of 0.91 mg/dL). Liver Function Tests: Recent Labs  Lab 06/01/24 0225 06/02/24 0429  AST 38 63*  ALT 24 24  ALKPHOS 59 41  BILITOT 0.3 0.7  PROT 7.7 6.2*  ALBUMIN 5.3* 4.0   Recent Labs  Lab 06/01/24 0225 06/03/24 0242  LIPASE 225* 40    Scheduled Meds:  enoxaparin   (LOVENOX ) injection  40 mg Subcutaneous Q24H   pantoprazole  (PROTONIX ) IV  40 mg Intravenous Q12H   potassium chloride   40 mEq Oral Once   sodium chloride  flush  3 mL Intravenous Q12H   Continuous Infusions:   LOS: 1 day   Time spent: 55 minutes  Kenneth Door, DO  [facility]  06/03/2024, 12:19 PM

## 2024-06-27 ENCOUNTER — Other Ambulatory Visit: Payer: Self-pay

## 2024-06-27 ENCOUNTER — Encounter (HOSPITAL_BASED_OUTPATIENT_CLINIC_OR_DEPARTMENT_OTHER): Payer: Self-pay

## 2024-06-27 ENCOUNTER — Observation Stay (HOSPITAL_BASED_OUTPATIENT_CLINIC_OR_DEPARTMENT_OTHER)
Admission: EM | Admit: 2024-06-27 | Discharge: 2024-06-29 | Disposition: A | Discharge status: Home / Self Care | Payer: MEDICAID | Attending: Internal Medicine | Admitting: Internal Medicine

## 2024-06-27 DIAGNOSIS — Z1152 Encounter for screening for COVID-19: Secondary | ICD-10-CM | POA: Insufficient documentation

## 2024-06-27 DIAGNOSIS — R1110 Vomiting, unspecified: Secondary | ICD-10-CM | POA: Diagnosis present

## 2024-06-27 DIAGNOSIS — F10920 Alcohol use, unspecified with intoxication, uncomplicated: Secondary | ICD-10-CM | POA: Diagnosis not present

## 2024-06-27 DIAGNOSIS — R112 Nausea with vomiting, unspecified: Principal | ICD-10-CM

## 2024-06-27 DIAGNOSIS — R066 Hiccough: Secondary | ICD-10-CM | POA: Insufficient documentation

## 2024-06-27 DIAGNOSIS — F12188 Cannabis abuse with other cannabis-induced disorder: Secondary | ICD-10-CM | POA: Diagnosis not present

## 2024-06-27 DIAGNOSIS — R1084 Generalized abdominal pain: Secondary | ICD-10-CM

## 2024-06-27 DIAGNOSIS — F1290 Cannabis use, unspecified, uncomplicated: Principal | ICD-10-CM

## 2024-06-27 HISTORY — DX: Other specified health status: Z78.9

## 2024-06-27 LAB — COMPREHENSIVE METABOLIC PANEL WITH GFR
ALT: 17 U/L (ref 0–44)
AST: 36 U/L (ref 15–41)
Albumin: 5.2 g/dL — ABNORMAL HIGH (ref 3.5–5.0)
Alkaline Phosphatase: 58 U/L (ref 38–126)
Anion gap: 22 — ABNORMAL HIGH (ref 5–15)
BUN: 9 mg/dL (ref 6–20)
CO2: 16 mmol/L — ABNORMAL LOW (ref 22–32)
Calcium: 10.7 mg/dL — ABNORMAL HIGH (ref 8.9–10.3)
Chloride: 102 mmol/L (ref 98–111)
Creatinine, Ser: 1.14 mg/dL (ref 0.61–1.24)
GFR, Estimated: >60 mL/min (ref >60)
Glucose, Bld: 160 mg/dL — ABNORMAL HIGH (ref 70–99)
Potassium: 4 mmol/L (ref 3.5–5.1)
Sodium: 140 mmol/L (ref 135–145)
Total Bilirubin: 0.4 mg/dL (ref 0.0–1.2)
Total Protein: 7.5 g/dL (ref 6.5–8.1)

## 2024-06-27 LAB — URINALYSIS, W/ REFLEX TO CULTURE (INFECTION SUSPECTED)
Bilirubin Urine: NEGATIVE
Glucose, UA: NEGATIVE mg/dL
Hgb urine dipstick: NEGATIVE
Ketones, ur: 40 mg/dL — AB
Leukocytes,Ua: NEGATIVE
Nitrite: NEGATIVE
Specific Gravity, Urine: 1.022 (ref 1.005–1.030)
pH: 8 (ref 5.0–8.0)

## 2024-06-27 LAB — URINE DRUG SCREEN
Amphetamines: NOT DETECTED
Barbiturates: NOT DETECTED
Benzodiazepines: NOT DETECTED
Cocaine: NOT DETECTED
Fentanyl: NOT DETECTED
Methadone Scn, Ur: NOT DETECTED
Opiates: DETECTED — AB
Tetrahydrocannabinol: DETECTED — AB

## 2024-06-27 LAB — CBC WITH DIFFERENTIAL/PLATELET
Abs Immature Granulocytes: 0.04 K/uL (ref 0.00–0.07)
Basophils Absolute: 0.1 K/uL (ref 0.0–0.1)
Basophils Relative: 1 %
Eosinophils Absolute: 0.2 K/uL (ref 0.0–0.5)
Eosinophils Relative: 1 %
HCT: 43.8 % (ref 39.0–52.0)
Hemoglobin: 15.1 g/dL (ref 13.0–17.0)
Immature Granulocytes: 0 %
Lymphocytes Relative: 10 %
Lymphs Abs: 1.2 K/uL (ref 0.7–4.0)
MCH: 28.8 pg (ref 26.0–34.0)
MCHC: 34.5 g/dL (ref 30.0–36.0)
MCV: 83.4 fL (ref 80.0–100.0)
Monocytes Absolute: 0.7 K/uL (ref 0.1–1.0)
Monocytes Relative: 6 %
Neutro Abs: 10 K/uL — ABNORMAL HIGH (ref 1.7–7.7)
Neutrophils Relative %: 82 %
Platelets: 318 K/uL (ref 150–400)
RBC: 5.25 MIL/uL (ref 4.22–5.81)
RDW: 11.7 % (ref 11.5–15.5)
WBC: 12.2 K/uL — ABNORMAL HIGH (ref 4.0–10.5)
nRBC: 0 % (ref 0.0–0.2)

## 2024-06-27 LAB — LIPASE, BLOOD: Lipase: 44 U/L (ref 11–51)

## 2024-06-27 LAB — MAGNESIUM: Magnesium: 1.9 mg/dL (ref 1.7–2.4)

## 2024-06-27 MED ORDER — MORPHINE SULFATE (PF) 4 MG/ML IV SOLN
4.0000 mg | Freq: Once | INTRAVENOUS | Status: AC
  Administered 2024-06-27: 4 mg via INTRAVENOUS
  Filled 2024-06-27: qty 1

## 2024-06-27 MED ORDER — HYDROMORPHONE HCL 1 MG/ML IJ SOLN
0.5000 mg | Freq: Once | INTRAMUSCULAR | Status: AC
  Administered 2024-06-27: 0.5 mg via INTRAVENOUS
  Filled 2024-06-27: qty 1

## 2024-06-27 MED ORDER — DROPERIDOL 2.5 MG/ML IJ SOLN
1.2500 mg | Freq: Once | INTRAMUSCULAR | Status: AC
  Administered 2024-06-27: 1.25 mg via INTRAMUSCULAR
  Filled 2024-06-27: qty 2

## 2024-06-27 MED ORDER — DIPHENHYDRAMINE HCL 50 MG/ML IJ SOLN
12.5000 mg | Freq: Once | INTRAMUSCULAR | Status: AC
  Administered 2024-06-27: 12.5 mg via INTRAVENOUS
  Filled 2024-06-27: qty 1

## 2024-06-27 MED ORDER — ONDANSETRON HCL 4 MG/2ML IJ SOLN
4.0000 mg | Freq: Once | INTRAMUSCULAR | Status: AC
  Administered 2024-06-27: 4 mg via INTRAVENOUS
  Filled 2024-06-27: qty 2

## 2024-06-27 MED ORDER — MORPHINE SULFATE (PF) 4 MG/ML IV SOLN: 4.0000 mg | Freq: Once | INTRAVENOUS | Status: DC

## 2024-06-27 MED ORDER — DIPHENHYDRAMINE HCL 50 MG/ML IJ SOLN: 12.5000 mg | Freq: Once | INTRAMUSCULAR | Status: DC

## 2024-06-27 MED ORDER — SODIUM CHLORIDE 0.9 % IV BOLUS
1000.0000 mL | Freq: Once | INTRAVENOUS | Status: AC
  Administered 2024-06-27: 1000 mL via INTRAVENOUS

## 2024-06-27 MED ORDER — PANTOPRAZOLE SODIUM 40 MG IV SOLR
40.0000 mg | Freq: Once | INTRAVENOUS | Status: AC
  Administered 2024-06-27: 40 mg via INTRAVENOUS
  Filled 2024-06-27: qty 10

## 2024-06-27 MED ORDER — PROCHLORPERAZINE EDISYLATE 10 MG/2ML IJ SOLN: 10.0000 mg | Freq: Once | INTRAMUSCULAR | Status: DC

## 2024-06-27 MED ORDER — SODIUM CHLORIDE 0.9 % IV SOLN
12.5000 mg | Freq: Four times a day (QID) | INTRAVENOUS | Status: DC | PRN
  Administered 2024-06-28: 12.5 mg via INTRAVENOUS
  Filled 2024-06-27: qty 0.5

## 2024-06-27 MED ORDER — MAGNESIUM SULFATE 2 GM/50ML IV SOLN
2.0000 g | Freq: Once | INTRAVENOUS | Status: DC
Start: 2024-06-27 — End: 2024-06-27

## 2024-06-27 MED ORDER — PROMETHAZINE HCL 25 MG/ML IJ SOLN
INTRAMUSCULAR | Status: DC
Start: 2024-06-27 — End: 2024-06-27
  Filled 2024-06-27: qty 1

## 2024-06-27 NOTE — ED Triage Notes (Signed)
 Arrives ambulatory to the ED with complaints of migraine headache, vomiting, and abdominal pain that started 2 hours prior to arrival. Rates headache pain an 8/10.

## 2024-06-27 NOTE — ED Notes (Signed)
 Lab called ALP is 58 not 17.  Provider notified

## 2024-06-27 NOTE — ED Provider Notes (Signed)
 [place] EMERGENCY DEPARTMENT AT [facility] Provider Note   CSN: [id number] Arrival date & time: 06/27/24  2019    Patient presents with: Migraine and Emesis   Kenneth Keller is a 18 y.o. male here for evaluation of vomiting.  He has had headache, vomiting and abdominal pain which started around 2 PM today.  He states has history of migraines this feels similar.  He denies any sudden onset thunderclap headache.  He also admits to some upper abdominal pain which began after the vomiting.  Multiple episodes of NBNB emesis.  No numbness, weakness, blurred vision, chest pain or shortness of breath.  He has been admitted previously for cannabinoid hyperemesis.  Initially denied marijuana use with myself however admitted to nursing staff.  Denies EtOH use.  No dysuria hematuria, changes in bowel movements.  No injury or trauma.   HPI     Prior to Admission medications   Medication Sig Start Date End Date Taking? Authorizing Provider  cetirizine (ZYRTEC) 10 MG tablet Take 10 mg by mouth daily as needed for allergies.    [provider]  OLANZapine -Samidorphan (LYBALVI ) 10-10 MG TABS Take 1 tablet by mouth daily.    [provider]  ondansetron  (ZOFRAN -ODT) 4 MG disintegrating tablet Take 1 tablet (4 mg total) by mouth every 6 (six) hours as needed for nausea or vomiting. 06/03/24   Laurence Locus, DO  pantoprazole  (PROTONIX ) 40 MG tablet Take 1 tablet (40 mg total) by mouth daily. 06/03/24 07/03/24  Laurence Locus, DO  promethazine  (PHENERGAN ) 25 MG suppository Place 1 suppository (25 mg total) rectally every 6 (six) hours as needed for nausea or vomiting (not responsive to po Zofran ). 06/03/24   Laurence Locus, DO  sucralfate  (CARAFATE ) 1 g tablet Take 1 tablet (1 g total) by mouth 4 (four) times daily -  with meals and at bedtime. 06/03/24 07/03/24  Laurence Locus, DO  viloxazine ER (QELBREE ) 200 MG 24 hr capsule Take 1 capsule (200 mg total) by mouth daily. 04/19/24   Mardy Elveria DEL, NP     Allergies: Other    Review of Systems  Constitutional: Negative.   HENT: Negative.    Respiratory: Negative.    Cardiovascular: Negative.   Gastrointestinal:  Positive for abdominal pain, nausea and vomiting. Negative for abdominal distention, anal bleeding, blood in stool, constipation, diarrhea and rectal pain.  Musculoskeletal: Negative.   Skin: Negative.   Neurological:  Positive for headaches. Negative for dizziness, tremors, seizures, syncope, speech difficulty, weakness, light-headedness and numbness.  All other systems reviewed and are negative.   Updated Vital Signs BP 133/68 (BP Location: Right Arm)   Pulse 71   Temp 98 F (36.7 C) (Oral)   Resp 16   Ht 5' 5 (1.651 m)   Wt 64 kg   SpO2 100%   BMI 23.46 kg/m   Physical Exam Vitals and nursing note reviewed.  Constitutional:      Appearance: He is well-developed. He is not ill-appearing, toxic-appearing or diaphoretic.     Comments: Actively vomiting  HENT:     Head: Normocephalic and atraumatic.     Nose: Nose normal.     Mouth/Throat:     Mouth: Mucous membranes are moist.  Eyes:     Pupils: Pupils are equal, round, and reactive to light.  Cardiovascular:     Rate and Rhythm: Regular rhythm. Tachycardia present.     Pulses: Normal pulses.          Radial pulses are 2+ on  the right side and 2+ on the left side.       Dorsalis pedis pulses are 2+ on the right side and 2+ on the left side.     Heart sounds: Normal heart sounds.  Pulmonary:     Effort: Pulmonary effort is normal. No respiratory distress.     Breath sounds: Normal breath sounds.  Abdominal:     General: Bowel sounds are normal. There is no distension.     Palpations: Abdomen is soft.     Tenderness: There is abdominal tenderness. There is no right CVA tenderness, left CVA tenderness, guarding or rebound.     Comments: Generalized tenderness, worse upper abdomen, no rebound or guarding  Musculoskeletal:        General: No swelling,  tenderness, deformity or signs of injury. Normal range of motion.     Cervical back: Normal range of motion and neck supple.     Right lower leg: No edema.     Left lower leg: No edema.  Skin:    General: Skin is warm and dry.     Capillary Refill: Capillary refill takes less than 2 seconds.     Comments: No obvious rashes or lesions on exposed skin  Neurological:     General: No focal deficit present.     Mental Status: He is alert and oriented to person, place, and time.     Cranial Nerves: No cranial nerve deficit.     Sensory: No sensory deficit.     Motor: No weakness.     Gait: Gait normal.     (all labs ordered are listed, but only abnormal results are displayed) Labs Reviewed  CBC WITH DIFFERENTIAL/PLATELET - Abnormal; Notable for the following components:      Result Value   WBC 12.2 (*)    Neutro Abs 10.0 (*)    All other components within normal limits  COMPREHENSIVE METABOLIC PANEL WITH GFR - Abnormal; Notable for the following components:   CO2 16 (*)    Glucose, Bld 160 (*)    Calcium  10.7 (*)    Albumin 5.2 (*)    Anion gap 22 (*)    All other components within normal limits  URINALYSIS, W/ REFLEX TO CULTURE (INFECTION SUSPECTED) - Abnormal; Notable for the following components:   APPearance HAZY (*)    Ketones, ur 40 (*)    Protein, ur TRACE (*)    Bacteria, UA RARE (*)    All other components within normal limits  URINE DRUG SCREEN - Abnormal; Notable for the following components:   Opiates DETECTED (*)    Tetrahydrocannabinol DETECTED (*)    All other components within normal limits  LIPASE, BLOOD  MAGNESIUM     EKG: EKG Interpretation Date/Time:  Saturday June 27 2024 20:44:34 EDT Ventricular Rate:  125 PR Interval:  116 QRS Duration:  99 QT Interval:  312 QTC Calculation: 450 R Axis:   70  Text Interpretation: Sinus tachycardia Borderline Q waves in lateral leads Confirmed by Mannie Pac [phone]) on 06/27/2024 10:43:57 PM  Radiology: No  results found.   Procedures   Medications Ordered in the ED  promethazine  (PHENERGAN ) 12.5 mg in sodium chloride  0.9 % 50 mL IVPB (has no administration in time range)  sodium chloride  0.9 % bolus 1,000 mL (0 mLs Intravenous Stopped 06/27/24 2233)  diphenhydrAMINE  (BENADRYL ) injection 12.5 mg (12.5 mg Intravenous Given 06/27/24 2051)  droperidol  (INAPSINE ) 2.5 MG/ML injection 1.25 mg (1.25 mg Intramuscular Given 06/27/24 2049)  morphine  (  PF) 4 MG/ML injection 4 mg (4 mg Intravenous Given 06/27/24 2123)  sodium chloride  0.9 % bolus 1,000 mL (0 mLs Intravenous Stopped 06/27/24 2233)  ondansetron  (ZOFRAN ) injection 4 mg (4 mg Intravenous Given 06/27/24 2218)  HYDROmorphone  (DILAUDID ) injection 0.5 mg (0.5 mg Intravenous Given 06/27/24 2316)  pantoprazole  (PROTONIX ) injection 40 mg (40 mg Intravenous Given 06/27/24 2315)    Clinical Course as of 06/28/24 0000  Sat Jun 27, 2024  2046 QTC 450 [BH]    Clinical Course User Index [BH] Peaches Vanoverbeke A, PA-C   18 year old history of migraines, cannabinoid hyperemesis here for evaluation of emesis.  Around 2:00 developed headache, denies any sudden onset thunderclap headache.  Then developed some vomiting and upper abdominal pain.  Initially denied any THC use with myself however then admitted to nursing staff that he has been smoking marijuana.  He has a nonfocal neuroexam without deficits.  He is actively vomiting on my evaluation.  His abdomen is diffusely tender to his upper abdomen however no focal pain.  He has no crepitus to his chest wall to suggest perforated viscus such as Boerhaave rupture.  He denies any traumatic injury will plan on labs, symptomatic treatment, EKG and reassess  Labs personally viewed and interpreted:  EKG without ischemic changes QTc 450 CBC leukocytosis 12.2 Metabolic panel glucose 160, anion gap 22 Magnesium  1.9 Lipase 44  Patient reassessed.  Patient got up to use the restroom, use a vape pen in the restroom.  Nursing  discussed with patient smoking would not be tolerated in the ED. he still getting IV fluids.  Will reassess  Reassessed.  Headache abdominal pain resolved however still vomiting.  Will give some Zofran .  He is on a second liter of IV fluids.  Patient reassessed.  Dry heaving.  Still having abdominal pain.  I offered CT scan however patient declines as he states he has had 2 abdominal CT scans over the last 2 months.  Headache resolved with prior droperidol .  I discussed admission for continued symptomatic management.  His UDS is positive for marijuana I suspect likely degree of cannabinoid hyperemesis.  He wants to trial additional medication prior to deciding on admission.   Patient reassessed.  Still having some pain and dry heaving.  He has not gotten Phenergan .  I had long discussion with patient for admission again and repeat imaging.  He declines repeat imaging again would like to trial Phenergan  before deciding about admission.  Care transferred to oncoming provider who will follow-up on reassessment and disposition                                 Medical Decision Making Amount and/or Complexity of Data Reviewed Independent Historian: parent External Data Reviewed: labs, radiology, ECG and notes. Labs: ordered. Decision-making details documented in ED Course. ECG/medicine tests: ordered and independent interpretation performed. Decision-making details documented in ED Course.  Risk OTC drugs. Prescription drug management. Parenteral controlled substances. Decision regarding hospitalization. Diagnosis or treatment significantly limited by social determinants of health.       Final diagnoses:  Cannabinoid hyperemesis syndrome  Generalized abdominal pain    ED Discharge Orders     None          Trica Usery A, PA-C 06/28/24 0000    Mannie Pac T, DO 06/28/24 1533

## 2024-06-28 ENCOUNTER — Encounter (HOSPITAL_COMMUNITY): Payer: Self-pay | Admitting: Internal Medicine

## 2024-06-28 DIAGNOSIS — R1110 Vomiting, unspecified: Secondary | ICD-10-CM

## 2024-06-28 DIAGNOSIS — R066 Hiccough: Secondary | ICD-10-CM | POA: Diagnosis not present

## 2024-06-28 DIAGNOSIS — Z1152 Encounter for screening for COVID-19: Secondary | ICD-10-CM | POA: Diagnosis not present

## 2024-06-28 DIAGNOSIS — F10920 Alcohol use, unspecified with intoxication, uncomplicated: Secondary | ICD-10-CM | POA: Diagnosis not present

## 2024-06-28 DIAGNOSIS — F12188 Cannabis abuse with other cannabis-induced disorder: Secondary | ICD-10-CM | POA: Diagnosis not present

## 2024-06-28 LAB — CBC
HCT: 42 % (ref 39.0–52.0)
Hemoglobin: 14.6 g/dL (ref 13.0–17.0)
MCH: 29.1 pg (ref 26.0–34.0)
MCHC: 34.8 g/dL (ref 30.0–36.0)
MCV: 83.8 fL (ref 80.0–100.0)
Platelets: 283 K/uL (ref 150–400)
RBC: 5.01 MIL/uL (ref 4.22–5.81)
RDW: 11.9 % (ref 11.5–15.5)
WBC: 8.5 K/uL (ref 4.0–10.5)
nRBC: 0 % (ref 0.0–0.2)

## 2024-06-28 LAB — BASIC METABOLIC PANEL WITH GFR
Anion gap: 11 (ref 5–15)
BUN: 5 mg/dL — ABNORMAL LOW (ref 6–20)
CO2: 23 mmol/L (ref 22–32)
Calcium: 9.3 mg/dL (ref 8.9–10.3)
Chloride: 106 mmol/L (ref 98–111)
Creatinine, Ser: 0.92 mg/dL (ref 0.61–1.24)
GFR, Estimated: >60 mL/min (ref >60)
Glucose, Bld: 136 mg/dL — ABNORMAL HIGH (ref 70–99)
Potassium: 3.6 mmol/L (ref 3.5–5.1)
Sodium: 140 mmol/L (ref 135–145)

## 2024-06-28 LAB — LACTIC ACID, PLASMA: Lactic Acid, Venous: 2.3 mmol/L (ref 0.5–1.9)

## 2024-06-28 LAB — MAGNESIUM: Magnesium: 1.8 mg/dL (ref 1.7–2.4)

## 2024-06-28 LAB — PROCALCITONIN: Procalcitonin: <0.1 ng/mL

## 2024-06-28 MED ORDER — CHLORPROMAZINE HCL 10 MG PO TABS
10.0000 mg | ORAL_TABLET | Freq: Three times a day (TID) | ORAL | Status: DC | PRN
  Administered 2024-06-28: 10 mg via ORAL
  Filled 2024-06-28 (×2): qty 1

## 2024-06-28 MED ORDER — VILOXAZINE HCL ER 200 MG PO CP24
200.0000 mg | ORAL_CAPSULE | Freq: Every day | ORAL | Status: DC
  Filled 2024-06-28: qty 1

## 2024-06-28 MED ORDER — OLANZAPINE-SAMIDORPHAN 10-10 MG PO TABS: 1.0000 | ORAL_TABLET | Freq: Every day | ORAL | Status: DC

## 2024-06-28 MED ORDER — CALCIUM CARBONATE ANTACID 500 MG PO CHEW
200.0000 mg | CHEWABLE_TABLET | Freq: Three times a day (TID) | ORAL | Status: DC
  Administered 2024-06-28 – 2024-06-29 (×4): 200 mg via ORAL
  Filled 2024-06-28 (×4): qty 1

## 2024-06-28 MED ORDER — CALCIUM CARBONATE ANTACID 500 MG PO CHEW: 200.0000 mg | CHEWABLE_TABLET | Freq: Three times a day (TID) | ORAL | Status: DC

## 2024-06-28 MED ORDER — LORATADINE 10 MG PO TABS
10.0000 mg | ORAL_TABLET | Freq: Every day | ORAL | Status: DC
  Filled 2024-06-28: qty 1

## 2024-06-28 MED ORDER — SODIUM CHLORIDE 0.9 % IV BOLUS
1000.0000 mL | Freq: Once | INTRAVENOUS | Status: AC
  Administered 2024-06-28: 1000 mL via INTRAVENOUS

## 2024-06-28 MED ORDER — PANTOPRAZOLE SODIUM 40 MG PO TBEC
40.0000 mg | DELAYED_RELEASE_TABLET | Freq: Every day | ORAL | Status: DC
  Administered 2024-06-28 – 2024-06-29 (×3): 40 mg via ORAL
  Filled 2024-06-28 (×2): qty 1

## 2024-06-28 MED ORDER — PROMETHAZINE HCL 25 MG/ML IJ SOLN
INTRAMUSCULAR | Status: AC
  Filled 2024-06-28: qty 1

## 2024-06-28 MED ORDER — HYDROMORPHONE HCL 1 MG/ML IJ SOLN
1.0000 mg | Freq: Once | INTRAMUSCULAR | Status: AC
  Administered 2024-06-28: 1 mg via INTRAVENOUS
  Filled 2024-06-28: qty 1

## 2024-06-28 MED ORDER — PROCHLORPERAZINE EDISYLATE 10 MG/2ML IJ SOLN
10.0000 mg | Freq: Once | INTRAMUSCULAR | Status: AC
  Administered 2024-06-28: 10 mg via INTRAVENOUS
  Filled 2024-06-28: qty 2

## 2024-06-28 MED ORDER — SODIUM BICARBONATE 8.4 % IV SOLN
INTRAVENOUS | Status: AC
  Filled 2024-06-28: qty 100

## 2024-06-28 MED ORDER — PROMETHAZINE (PHENERGAN) 6.25MG IN NS 50ML IVPB: 6.2500 mg | Freq: Four times a day (QID) | INTRAVENOUS | Status: DC | PRN

## 2024-06-28 MED ORDER — ACETAMINOPHEN 10 MG/ML IV SOLN
1000.0000 mg | Freq: Four times a day (QID) | INTRAVENOUS | Status: AC
  Administered 2024-06-28: 1000 mg via INTRAVENOUS
  Filled 2024-06-28 (×4): qty 100

## 2024-06-28 MED ORDER — HYDROMORPHONE HCL 1 MG/ML IJ SOLN: 0.5000 mg | INTRAMUSCULAR | Status: DC | PRN

## 2024-06-28 MED ORDER — SODIUM CHLORIDE 0.45 % IV SOLN
INTRAVENOUS | Status: DC
  Filled 2024-06-28 (×2): qty 75

## 2024-06-28 MED ORDER — SODIUM CHLORIDE 0.9 % IV SOLN: INTRAVENOUS | Status: AC

## 2024-06-28 MED ORDER — SUCRALFATE 1 G PO TABS
1.0000 g | ORAL_TABLET | Freq: Three times a day (TID) | ORAL | Status: DC
  Administered 2024-06-28 – 2024-06-29 (×3): 1 g via ORAL
  Filled 2024-06-28 (×4): qty 1

## 2024-06-28 NOTE — ED Notes (Signed)
 Called [facility] for transport.  5C called at [facility] and informed them that [facility] has been called for tranport.  No questions at this time.

## 2024-06-28 NOTE — Plan of Care (Signed)
 Plan of Care Note for accepted transfer   Patient: Kenneth Keller MRN: [id number]   DOA: 06/27/2024  Facility requesting transfer: [facility]  Requesting Provider: Roselyn MD Reason for transfer: Management of Nausea and vomiting.   Facility course: 18 y.o. male presented to ED  for evaluation of vomiting.  He has had headache, vomiting and abdominal pain which started around 2 PM today.  He states has history of migraines this feels similar.  No headache.  He also admits to some upper abdominal pain which began after the vomiting.  Multiple episodes of NBNB emesis.  No numbness, weakness, blurred vision, chest pain or shortness of breath.  Admitted for intractable vomiting and electrolyte replacement.   Plan of care: The patient is accepted for admission to Med-surg  unit, at [facility]..  Intractable vomiting in the setting of Marijuana use   Author: Albina Sor, MD 06/28/2024  Check [email or web address] for on-call coverage.  Nursing staff, Please call [facility] Admits & Consults System-Wide number on Amion as soon as patient's arrival, so appropriate admitting provider can evaluate the pt.

## 2024-06-28 NOTE — ED Provider Notes (Signed)
 Care assumed at shift change. Here for vomiting, history of same. Pending reassessment after meds.  Physical Exam  BP 134/73   Pulse 61   Temp 98.6 F (37 C) (Oral)   Resp 15   Ht 5' 5 (1.651 m)   Wt 64 kg   SpO2 99%   BMI 23.46 kg/m   Physical Exam  Procedures  Procedures  ED Course / MDM   Clinical Course as of 06/28/24 0433  Sat Jun 27, 2024  2046 QTC 450 [BH]  Sun Jun 28, 2024  9944 Patient not feeling better after phenergan . Amenable to admission. Hospitalist paged.  [CS]  0137 Spoke with Dr. Von, Hospitalist, who will accept for admission. Requests Bicarb drip for his metabolic acidosis.  [CS]    Clinical Course User Index [BH] Henderly, Britni A, PA-C [CS] Roselyn Carlin NOVAK, MD   Medical Decision Making Problems Addressed: Cannabinoid hyperemesis syndrome: chronic illness or injury Generalized abdominal pain: chronic illness or injury with exacerbation, progression, or side effects of treatment Intractable vomiting: acute illness or injury  Amount and/or Complexity of Data Reviewed Labs: ordered. Decision-making details documented in ED Course.  Risk Prescription drug management. Parenteral controlled substances. Decision regarding hospitalization.          Roselyn Carlin NOVAK, MD 06/28/24 [id number]

## 2024-06-28 NOTE — H&P (Addendum)
 History and Physical    Patient: Kenneth Keller FMW:[id number] DOB: 29-Nov-2005 DOA: 06/27/2024 DOS: the patient was seen and examined on 06/28/2024 PCP: Dorothyann Ped, MD  Patient coming from: Home  Chief Complaint:  Chief Complaint  Patient presents with   Migraine   Emesis   HPI: Kenneth Keller is a 18 y.o. male with medical history significant of ADHD, kidney stones, cannabinoid hyperemesis syndrome, psychiatric history including anxiety, depression, and prior history of self mutilation presents with intractable n/v.  Pt was in [facility] until yesterday when he had a headache. His headache began around 1800 and progressed to n/v on his way to the hospital for evaluation. He thinks he may have had approximately 20 episodes of vomiting over yesterday.  Of note, pt was admitted with similar sx a month ago and treated for cannabinoid hyperemesis syndrome. He reports cutting back with a goal of stopping altogether.   In the ED, tachycardic, and tachypneic on RA. Labs notable for lactic acid 2.3 and WBC 12.2-->8.5 (s/p 3L NS). EDP requested admission for ongoing intractable n/v.  Review of Systems: As mentioned in the history of present illness. All other systems reviewed and are negative. Past Medical History:  Diagnosis Date   ADHD    Anxiety    Kidney stones    MDD (major depressive disorder)    Medical history non-contributory    Self-mutilation    Past Surgical History:  Procedure Laterality Date   CLOSED REDUCTION FIBULA Right 01/28/2017   Procedure: CLOSED REDUCTION TIBIA/FIBULA  WITH CASTING;  Surgeon: Ozell Bruch, MD;  Location: [facility] OR;  Service: Orthopedics;  Laterality: Right;   CLOSED REDUCTION FINGER WITH PERCUTANEOUS PINNING Right 05/03/2022   Procedure: CLOSED REDUCTION PERCUTANEOUS PINNING RIGHT SMALL METACARPAL FRACTURE AND RIGHT RING FINGER METACARPAL;  Surgeon: Murrell Drivers, MD;  Location: [facility];  Service: Orthopedics;  Laterality: Right;   CLOSED  REDUCTION METACARPAL WITH PERCUTANEOUS PINNING Left 03/24/2021   Procedure: CLOSED REDUCTION METACARPAL WITH PERCUTANEOUS PINNING;  Surgeon: Murrell Drivers, MD;  Location:  SURGERY CENTER;  Service: Orthopedics;  Laterality: Left;   CLOSED REDUCTION RADIAL SHAFT Right 02/12/2021   Procedure: CLOSED REDUCTION RADIAL DISTAL FRACTURE,;  Surgeon: Murrell Drivers, MD;  Location: [facility] OR;  Service: Orthopedics;  Laterality: Right;   Social History:  reports that he has never smoked. He has never used smokeless tobacco. He reports current alcohol use of about 2.0 standard drinks of alcohol per week. He reports current drug use. Drug: Marijuana.  Allergies  Allergen Reactions   Other Other (See Comments)    Pet dander and seasonal allergies (exposure results in allergic symptoms)    Family History  Problem Relation Age of Onset   Heart failure Mother    Heart failure Father    Hypertension Father     Prior to Admission medications   Medication Sig Start Date End Date Taking? Authorizing Provider  cetirizine (ZYRTEC) 10 MG tablet Take 10 mg by mouth daily as needed for allergies.    [provider]  OLANZapine -Samidorphan (LYBALVI ) 10-10 MG TABS Take 1 tablet by mouth daily.    [provider]  ondansetron  (ZOFRAN -ODT) 4 MG disintegrating tablet Take 1 tablet (4 mg total) by mouth every 6 (six) hours as needed for nausea or vomiting. 06/03/24   Laurence Locus, DO  pantoprazole  (PROTONIX ) 40 MG tablet Take 1 tablet (40 mg total) by mouth daily. 06/03/24 07/03/24  Laurence Locus, DO  promethazine  (PHENERGAN ) 25 MG suppository Place 1 suppository (25 mg  total) rectally every 6 (six) hours as needed for nausea or vomiting (not responsive to po Zofran ). 06/03/24   Laurence Locus, DO  sucralfate  (CARAFATE ) 1 g tablet Take 1 tablet (1 g total) by mouth 4 (four) times daily -  with meals and at bedtime. 06/03/24 07/03/24  Laurence Locus, DO  viloxazine ER (QELBREE ) 200 MG 24 hr capsule Take 1 capsule (200  mg total) by mouth daily. 04/19/24   Mardy Elveria DEL, NP    Physical Exam: Vitals:   06/28/24 0400 06/28/24 0614 06/28/24 0730 06/28/24 1208  BP: 125/82 128/75 (!) 139/98 120/78  Pulse: 77 (!) 109 100 99  Resp: (!) 21 16 20 18   Temp:  98.7 F (37.1 C) 97.6 F (36.4 C) 98.6 F (37 C)  TempSrc:  Oral Oral Oral  SpO2: 99% 99% 100% 100%  Weight:      Height:       General: Alert, oriented x3, resting comfortably in no acute distress Respiratory: Lungs clear to auscultation bilaterally with normal respiratory effort; no w/r/r Cardiovascular: Regular rate and rhythm w/o m/r/g   Data Reviewed:  Lab Results  Component Value Date   WBC 8.5 06/28/2024   HGB 14.6 06/28/2024   HCT 42.0 06/28/2024   MCV 83.8 06/28/2024   PLT 283 06/28/2024   Lab Results  Component Value Date   GLUCOSE 136 (H) 06/28/2024   CALCIUM  9.3 06/28/2024   NA 140 06/28/2024   K 3.6 06/28/2024   CO2 23 06/28/2024   CL 106 06/28/2024   BUN 5 (L) 06/28/2024   CREATININE 0.92 06/28/2024   Lab Results  Component Value Date   ALT 17 06/27/2024   AST 36 06/27/2024   ALKPHOS 58 06/27/2024   BILITOT 0.4 06/27/2024   No results found for: INR, PROTIME  Radiology: No results found.  Assessment and Plan: 73M h/o ADHD, kidney stones, cannabinoid hyperemesis syndrome, psychiatric history including anxiety, depression, and prior history of self mutilation presents with intractable n/v.  Intractable n/v Cannabinoid hyperemesis syndrome -MIVF: NS at 150cc/h for now -IV phenergan  6.25mg  q6h prn -IV tylenol  1g QID for today -Tums TID AC -PTA sucralfate  and PPI -ADAT  Intractable hiccups -Start thorazine  prn   Advance Care Planning:   Code Status: Prior   Consults: N/A  Family Communication: Mother  Severity of Illness: The appropriate patient status for this patient is OBSERVATION. Observation status is judged to be reasonable and necessary in order to provide the required intensity of  service to ensure the patient's safety. The patient's presenting symptoms, physical exam findings, and initial radiographic and laboratory data in the context of their medical condition is felt to place them at decreased risk for further clinical deterioration. Furthermore, it is anticipated that the patient will be medically stable for discharge from the hospital within 2 midnights of admission.    ------- I spent 55 minutes reviewing previous notes, at the bedside counseling/discussing the treatment plan, and performing clinical documentation.  Author: Marsha Ada, MD 06/28/2024 1:25 PM  For on call review [email or web address].

## 2024-06-28 NOTE — ED Notes (Signed)
 Patient transported at this time via [facility].  All personal belongings transported with patient

## 2024-06-29 ENCOUNTER — Encounter: Payer: Self-pay | Admitting: Internal Medicine

## 2024-06-29 DIAGNOSIS — R1110 Vomiting, unspecified: Secondary | ICD-10-CM | POA: Diagnosis not present

## 2024-06-29 LAB — CBC
HCT: 44.2 % (ref 39.0–52.0)
Hemoglobin: 15.1 g/dL (ref 13.0–17.0)
MCH: 28.8 pg (ref 26.0–34.0)
MCHC: 34.2 g/dL (ref 30.0–36.0)
MCV: 84.2 fL (ref 80.0–100.0)
Platelets: 280 K/uL (ref 150–400)
RBC: 5.25 MIL/uL (ref 4.22–5.81)
RDW: 11.7 % (ref 11.5–15.5)
WBC: 7.7 K/uL (ref 4.0–10.5)
nRBC: 0 % (ref 0.0–0.2)

## 2024-06-29 LAB — BASIC METABOLIC PANEL WITH GFR
Anion gap: 10 (ref 5–15)
BUN: 6 mg/dL (ref 6–20)
CO2: 22 mmol/L (ref 22–32)
Calcium: 9.1 mg/dL (ref 8.9–10.3)
Chloride: 107 mmol/L (ref 98–111)
Creatinine, Ser: 0.83 mg/dL (ref 0.61–1.24)
GFR, Estimated: >60 mL/min (ref >60)
Glucose, Bld: 100 mg/dL — ABNORMAL HIGH (ref 70–99)
Potassium: 3.5 mmol/L (ref 3.5–5.1)
Sodium: 139 mmol/L (ref 135–145)

## 2024-06-29 LAB — LACTIC ACID, PLASMA: Lactic Acid, Venous: 1.7 mmol/L (ref 0.5–1.9)

## 2024-06-29 NOTE — Discharge Summary (Signed)
 Physician Discharge Summary   Patient: Nazaire Cordial MRN: [id number] DOB: 2006/04/01  Admit date:     06/27/2024  Discharge date: 06/29/24  Discharge Physician: MDALA-GAUSI, GOLDEN PILLOW   PCP: Vapne, Ekaterina, MD   Recommendations at discharge:   Follow-up with PCP  Discharge Diagnoses: Principal Problem:   Intractable vomiting  Resolved Problems:   * No resolved hospital problems. *  Hospital Course: 18 year old man with PMH of ADHD, kidney stones, cannabinoid hyperemesis syndrome, psychiatric history including anxiety, depression, and prior history of self mutilation who presented with intractable nausea, vomiting.  Prior history of admission for cannabinoid hyperemesis syndrome.  Patient was admitted to the hospitalist service and started on IV fluids, antiemetics.  Symptoms resolved with treatment.  Lactic acid, which was elevated to 2.3 on presentation, trended down.  Leukocytosis and acute kidney injury resolved.  By the day of discharge, the patient was tolerating a diet with no nausea or vomiting.  He was discharged home in stable condition.   Consultants: n/a Procedures performed: n/a  Disposition: Home Diet recommendation:  Discharge Diet Orders (From admission, onward)     Start     Ordered   06/29/24 0000  Diet - low sodium heart healthy        06/29/24 1220           Regular diet DISCHARGE MEDICATION: Allergies as of 06/29/2024       Reactions   Other Other (See Comments)   Pet dander and seasonal allergies (exposure results in allergic symptoms)        Medication List     TAKE these medications    acetaminophen  500 MG tablet Commonly known as: TYLENOL  Take 500-1,000 mg by mouth every 6 (six) hours as needed for moderate pain (pain score 4-6).   cetirizine 10 MG tablet Commonly known as: ZYRTEC Take 10 mg by mouth daily as needed for allergies.   ibuprofen  200 MG tablet Commonly known as: ADVIL  Take 200-800 mg by mouth every 6 (six)  hours as needed for moderate pain (pain score 4-6).   Lybalvi  10-10 MG Tabs Generic drug: OLANZapine -Samidorphan Take 1 tablet by mouth daily.   ondansetron  4 MG disintegrating tablet Commonly known as: ZOFRAN -ODT Take 1 tablet (4 mg total) by mouth every 6 (six) hours as needed for nausea or vomiting.   pantoprazole  40 MG tablet Commonly known as: Protonix  Take 1 tablet (40 mg total) by mouth daily.   promethazine  25 MG suppository Commonly known as: PHENERGAN  Place 1 suppository (25 mg total) rectally every 6 (six) hours as needed for nausea or vomiting (not responsive to po Zofran ).   sucralfate  1 g tablet Commonly known as: Carafate  Take 1 tablet (1 g total) by mouth 4 (four) times daily -  with meals and at bedtime.   viloxazine ER 200 MG 24 hr capsule Commonly known as: Qelbree  Take 1 capsule (200 mg total) by mouth daily.        Discharge Exam: Filed Weights   06/27/24 2028  Weight: 64 kg   Physical Exam   General: Alert, oriented X3  Eyes: Pupils equal, reactive  Oral cavity: moist mucous membranes  Head: Atraumatic, normocephalic  Neck: supple  Chest: clear to auscultation. No crackles, no wheezes  CVS: S1,S2 RRR. No murmurs  Abd: No distention, soft, non-tender. No masses palpable  Extr: No edema   MSK: No joint deformities or swelling  Neurological: Grossly intact.    Condition at discharge: good  The results of significant diagnostics from  this hospitalization (including imaging, microbiology, ancillary and laboratory) are listed below for reference.   Imaging Studies: CT ABDOMEN PELVIS W CONTRAST Result Date: 06/01/2024 CLINICAL DATA:  Abdominal pain. Pancreatitis suspected. Possible recurring cannabis related hyperemesis syndrome. Similar symptoms last month. EXAM: CT ABDOMEN AND PELVIS WITH CONTRAST TECHNIQUE: Multidetector CT imaging of the abdomen and pelvis was performed using the standard protocol following bolus administration of intravenous  contrast. RADIATION DOSE REDUCTION: This exam was performed according to the departmental dose-optimization program which includes automated exposure control, adjustment of the mA and/or kV according to patient size and/or use of iterative reconstruction technique. CONTRAST:  80mL OMNIPAQUE  IOHEXOL  300 MG/ML  SOLN COMPARISON:  CT with IV contrast 04/20/2024, CT with IV contrast 09/30/2019. FINDINGS: Lower chest: No acute abnormality. Clear lung bases. Normal cardiac size. Small hiatal hernia. Hepatobiliary: No abnormality. Pancreas: No abnormality. Specifically, no mass enhancement, ductal dilatation or peripancreatic inflammation. Spleen: No abnormality. Adrenals/Urinary Tract: No abnormality. Stomach/Bowel: There are thickened folds in the proximal stomach. There are thickened folds in the jejunum. Findings consistent with gastroenteritis. There is no small bowel dilatation or inflammatory change. There is a well visible normal appendix. The large intestine is unremarkable. Vascular/Lymphatic: No significant vascular findings are present. No enlarged abdominal or pelvic lymph nodes. Reproductive: Prostate is unremarkable. Other: Small umbilical fat hernia. No incarcerated hernia. No free fluid. Pelvic phleboliths. Musculoskeletal: No acute or significant osseous findings. IMPRESSION: 1. Thickened folds in the proximal stomach and jejunum consistent with gastroenteritis. No mesenteric inflammatory change. 2. Small hiatal hernia.  Small umbilical fat hernia. 3. No CT findings of pancreatitis. Electronically Signed   By: Francis Quam M.D.   On: 06/01/2024 03:50    Microbiology: Results for orders placed or performed during the hospital encounter of 07/07/21  Resp panel by RT-PCR (RSV, Flu A&B, Covid) Nasopharyngeal Swab     Status: None   Collection Time: 07/08/21  6:31 AM   Specimen: Nasopharyngeal Swab; Nasopharyngeal(NP) swabs in vial transport medium  Result Value Ref Range Status   SARS Coronavirus 2  by RT PCR NEGATIVE NEGATIVE Final    Comment: (NOTE) SARS-CoV-2 target nucleic acids are NOT DETECTED.  The SARS-CoV-2 RNA is generally detectable in upper respiratory specimens during the acute phase of infection. The lowest concentration of SARS-CoV-2 viral copies this assay can detect is 138 copies/mL. A negative result does not preclude SARS-Cov-2 infection and should not be used as the sole basis for treatment or other patient management decisions. A negative result may occur with  improper specimen collection/handling, submission of specimen other than nasopharyngeal swab, presence of viral mutation(s) within the areas targeted by this assay, and inadequate number of viral copies(<138 copies/mL). A negative result must be combined with clinical observations, patient history, and epidemiological information. The expected result is Negative.  Fact Sheet for Patients:  BloggerCourse.com  Fact Sheet for Healthcare Providers:  SeriousBroker.it  This test is no t yet approved or cleared by the [place]  FDA and  has been authorized for detection and/or diagnosis of SARS-CoV-2 by FDA under an Emergency Use Authorization (EUA). This EUA will remain  in effect (meaning this test can be used) for the duration of the COVID-19 declaration under Section 564(b)(1) of the Act, 21 U.S.C.section 360bbb-3(b)(1), unless the authorization is terminated  or revoked sooner.       Influenza A by PCR NEGATIVE NEGATIVE Final   Influenza B by PCR NEGATIVE NEGATIVE Final    Comment: (NOTE) The Xpert Xpress SARS-CoV-2/FLU/RSV plus assay is  intended as an aid in the diagnosis of influenza from Nasopharyngeal swab specimens and should not be used as a sole basis for treatment. Nasal washings and aspirates are unacceptable for Xpert Xpress SARS-CoV-2/FLU/RSV testing.  Fact Sheet for Patients: BloggerCourse.com  Fact  Sheet for Healthcare Providers: SeriousBroker.it  This test is not yet approved or cleared by the [place]  FDA and has been authorized for detection and/or diagnosis of SARS-CoV-2 by FDA under an Emergency Use Authorization (EUA). This EUA will remain in effect (meaning this test can be used) for the duration of the COVID-19 declaration under Section 564(b)(1) of the Act, 21 U.S.C. section 360bbb-3(b)(1), unless the authorization is terminated or revoked.     Resp Syncytial Virus by PCR NEGATIVE NEGATIVE Final    Comment: (NOTE) Fact Sheet for Patients: BloggerCourse.com  Fact Sheet for Healthcare Providers: SeriousBroker.it  This test is not yet approved or cleared by the [place]  FDA and has been authorized for detection and/or diagnosis of SARS-CoV-2 by FDA under an Emergency Use Authorization (EUA). This EUA will remain in effect (meaning this test can be used) for the duration of the COVID-19 declaration under Section 564(b)(1) of the Act, 21 U.S.C. section 360bbb-3(b)(1), unless the authorization is terminated or revoked.  Performed at [facility], [address] [place], [place] [address]     Labs: CBC: Recent Labs  Lab 06/27/24 2036 06/28/24 0943 06/29/24 1009  WBC 12.2* 8.5 7.7  NEUTROABS 10.0*  --   --   HGB 15.1 14.6 15.1  HCT 43.8 42.0 44.2  MCV 83.4 83.8 84.2  PLT 318 283 280   Basic Metabolic Panel: Recent Labs  Lab 06/27/24 2036 06/28/24 0943 06/29/24 1009  NA 140 140 139  K 4.0 3.6 3.5  CL 102 106 107  CO2 16* 23 22  GLUCOSE 160* 136* 100*  BUN 9 5* 6  CREATININE 1.14 0.92 0.83  CALCIUM  10.7* 9.3 9.1  MG 1.9 1.8  --    Liver Function Tests: Recent Labs  Lab 06/27/24 2036  AST 36  ALT 17  ALKPHOS 58  BILITOT 0.4  PROT 7.5  ALBUMIN 5.2*   CBG: No results for input(s): GLUCAP in the last 168 hours.  Discharge time spent:  greater than 30 minutes.  Signed: MDALA-GAUSI, Lucyle Alumbaugh AGATHA, MD [facility] 06/29/2024

## 2024-06-29 NOTE — TOC Transition Note (Signed)
 Transition of Care [facility]) - Discharge Note   Patient Details  Name: Kenneth Keller MRN: [id number] Date of Birth: [birth year]  Transition of Care [facility]) CM/SW Contact:  Roxie KANDICE Stain, RN Phone Number: 06/29/2024, 12:45 PM   Clinical Narrative:    [name] is stable to discharge home.  Follow up with PCP. No TOC needs at this time.    Final next level of care: Home/Self Care Barriers to Discharge: Barriers Resolved   Patient Goals and CMS Choice Patient states their goals for this hospitalization and ongoing recovery are:: return home          Discharge Placement               home    Discharge Plan and Services Additional resources added to the After Visit Summary for                                       Social Drivers of Health (SDOH) Interventions SDOH Screenings   Food Insecurity: No Food Insecurity (06/28/2024)  Housing: Low Risk  (06/28/2024)  Transportation Needs: No Transportation Needs (06/28/2024)  Utilities: Not At Risk (06/28/2024)  Depression (PHQ2-9): Low Risk  (02/23/2021)  Social Connections: Moderately Isolated (06/28/2024)  Stress: No Stress Concern Present (10/07/2019)  Tobacco Use: Low Risk  (06/28/2024)     Readmission Risk Interventions     No data to display

## 2024-06-29 NOTE — TOC CM/SW Note (Addendum)
 Transition of Care [facility]) - Inpatient Brief Assessment   Patient Details  Name: Kenneth Keller MRN: [id number] Date of Birth: 02-25-2006  Transition of Care [facility]) CM/SW Contact:    Lauraine FORBES Saa, LCSWA Phone Number: 06/29/2024, 9:19 AM   Clinical Narrative:  9:19 AM Per chart review, patient resides at home with parent(s). Patient does not have SNF or HH history. Patient has DME (manual wheelchair, crutches) history with [facility]. Patient's preferred pharmacy's are [facility], [facility] [phone], and [facility] [phone] [facility]. No TOC needs were identified at this time. TOC will continue to follow and be available to assist.  Transition of Care Asessment: Insurance and Status: Insurance coverage has been reviewed Patient has primary care physician: Yes Home environment has been reviewed: Private Residence Prior level of function:: N/A Prior/Current Home Services: No current home services Social Drivers of Health Review: SDOH reviewed no interventions necessary Readmission risk has been reviewed: Yes (Currently Observation Status) Transition of care needs: no transition of care needs at this time

## 2024-07-26 ENCOUNTER — Encounter (HOSPITAL_BASED_OUTPATIENT_CLINIC_OR_DEPARTMENT_OTHER): Payer: Self-pay

## 2024-07-26 ENCOUNTER — Other Ambulatory Visit: Payer: Self-pay

## 2024-07-26 ENCOUNTER — Inpatient Hospital Stay (HOSPITAL_BASED_OUTPATIENT_CLINIC_OR_DEPARTMENT_OTHER)
Admission: EM | Admit: 2024-07-26 | Discharge: 2024-07-29 | DRG: 392 | Disposition: A | Discharge status: Home / Self Care | Payer: MEDICAID | Attending: Internal Medicine | Admitting: Internal Medicine

## 2024-07-26 ENCOUNTER — Emergency Department (HOSPITAL_BASED_OUTPATIENT_CLINIC_OR_DEPARTMENT_OTHER): Payer: MEDICAID

## 2024-07-26 DIAGNOSIS — D72829 Elevated white blood cell count, unspecified: Secondary | ICD-10-CM | POA: Diagnosis present

## 2024-07-26 DIAGNOSIS — F1210 Cannabis abuse, uncomplicated: Secondary | ICD-10-CM | POA: Diagnosis present

## 2024-07-26 DIAGNOSIS — F419 Anxiety disorder, unspecified: Secondary | ICD-10-CM | POA: Diagnosis present

## 2024-07-26 DIAGNOSIS — Z8249 Family history of ischemic heart disease and other diseases of the circulatory system: Secondary | ICD-10-CM

## 2024-07-26 DIAGNOSIS — R9431 Abnormal electrocardiogram [ECG] [EKG]: Secondary | ICD-10-CM | POA: Diagnosis present

## 2024-07-26 DIAGNOSIS — R112 Nausea with vomiting, unspecified: Principal | ICD-10-CM | POA: Diagnosis present

## 2024-07-26 DIAGNOSIS — E872 Acidosis, unspecified: Secondary | ICD-10-CM | POA: Diagnosis present

## 2024-07-26 DIAGNOSIS — E876 Hypokalemia: Secondary | ICD-10-CM | POA: Diagnosis present

## 2024-07-26 DIAGNOSIS — R1010 Upper abdominal pain, unspecified: Secondary | ICD-10-CM

## 2024-07-26 DIAGNOSIS — Z91048 Other nonmedicinal substance allergy status: Secondary | ICD-10-CM

## 2024-07-26 DIAGNOSIS — F17290 Nicotine dependence, other tobacco product, uncomplicated: Secondary | ICD-10-CM | POA: Diagnosis present

## 2024-07-26 DIAGNOSIS — R739 Hyperglycemia, unspecified: Secondary | ICD-10-CM

## 2024-07-26 DIAGNOSIS — F12188 Cannabis abuse with other cannabis-induced disorder: Secondary | ICD-10-CM | POA: Diagnosis present

## 2024-07-26 DIAGNOSIS — R000 Tachycardia, unspecified: Secondary | ICD-10-CM | POA: Diagnosis present

## 2024-07-26 DIAGNOSIS — R7401 Elevation of levels of liver transaminase levels: Secondary | ICD-10-CM | POA: Diagnosis present

## 2024-07-26 DIAGNOSIS — Z79899 Other long term (current) drug therapy: Secondary | ICD-10-CM

## 2024-07-26 DIAGNOSIS — E861 Hypovolemia: Secondary | ICD-10-CM | POA: Diagnosis present

## 2024-07-26 DIAGNOSIS — K5900 Constipation, unspecified: Secondary | ICD-10-CM | POA: Diagnosis present

## 2024-07-26 DIAGNOSIS — F339 Major depressive disorder, recurrent, unspecified: Secondary | ICD-10-CM | POA: Diagnosis present

## 2024-07-26 LAB — COMPREHENSIVE METABOLIC PANEL WITH GFR
ALT: 20 U/L (ref 0–44)
AST: 46 U/L — ABNORMAL HIGH (ref 15–41)
Albumin: 5.2 g/dL — ABNORMAL HIGH (ref 3.5–5.0)
Alkaline Phosphatase: 71 U/L (ref 38–126)
Anion gap: 22 — ABNORMAL HIGH (ref 5–15)
BUN: 5 mg/dL — ABNORMAL LOW (ref 6–20)
CO2: 18 mmol/L — ABNORMAL LOW (ref 22–32)
Calcium: 10.6 mg/dL — ABNORMAL HIGH (ref 8.9–10.3)
Chloride: 101 mmol/L (ref 98–111)
Creatinine, Ser: 1.04 mg/dL (ref 0.61–1.24)
GFR, Estimated: >60 mL/min (ref >60)
Glucose, Bld: 132 mg/dL — ABNORMAL HIGH (ref 70–99)
Potassium: 3.3 mmol/L — ABNORMAL LOW (ref 3.5–5.1)
Sodium: 140 mmol/L (ref 135–145)
Total Bilirubin: 0.4 mg/dL (ref 0.0–1.2)
Total Protein: 7.7 g/dL (ref 6.5–8.1)

## 2024-07-26 LAB — CBC
HCT: 45.2 % (ref 39.0–52.0)
Hemoglobin: 15.6 g/dL (ref 13.0–17.0)
MCH: 28.9 pg (ref 26.0–34.0)
MCHC: 34.5 g/dL (ref 30.0–36.0)
MCV: 83.7 fL (ref 80.0–100.0)
Platelets: 380 K/uL (ref 150–400)
RBC: 5.4 MIL/uL (ref 4.22–5.81)
RDW: 12.2 % (ref 11.5–15.5)
WBC: 13.3 K/uL — ABNORMAL HIGH (ref 4.0–10.5)
nRBC: 0 % (ref 0.0–0.2)

## 2024-07-26 LAB — LIPASE, BLOOD: Lipase: 59 U/L — ABNORMAL HIGH (ref 11–51)

## 2024-07-26 MED ORDER — IOHEXOL 300 MG/ML  SOLN
100.0000 mL | Freq: Once | INTRAMUSCULAR | Status: AC | PRN
  Administered 2024-07-27: 75 mL via INTRAVENOUS

## 2024-07-26 MED ORDER — ONDANSETRON HCL 4 MG/2ML IJ SOLN
4.0000 mg | Freq: Once | INTRAMUSCULAR | Status: AC
  Administered 2024-07-26: 4 mg via INTRAVENOUS
  Filled 2024-07-26: qty 2

## 2024-07-26 MED ORDER — ONDANSETRON 4 MG PO TBDP
8.0000 mg | ORAL_TABLET | Freq: Once | ORAL | Status: DC
  Filled 2024-07-26: qty 2

## 2024-07-26 MED ORDER — SODIUM CHLORIDE 0.9 % IV BOLUS
1000.0000 mL | Freq: Once | INTRAVENOUS | Status: AC
  Administered 2024-07-26: 1000 mL via INTRAVENOUS

## 2024-07-26 MED ORDER — MORPHINE SULFATE (PF) 4 MG/ML IV SOLN
4.0000 mg | Freq: Once | INTRAVENOUS | Status: AC
  Administered 2024-07-26: 4 mg via INTRAVENOUS
  Filled 2024-07-26: qty 1

## 2024-07-26 MED ORDER — OXYCODONE-ACETAMINOPHEN 5-325 MG PO TABS
1.0000 | ORAL_TABLET | Freq: Once | ORAL | Status: DC
  Filled 2024-07-26: qty 1

## 2024-07-26 NOTE — ED Triage Notes (Signed)
 Pt has been constipated x 5 days so he took some miralax  at approx 1500 and had a BM 30 mins prior to arrival. Pt states that after taking laxatives, he began having nausea and vomiting and abdominal cramping.

## 2024-07-26 NOTE — ED Provider Notes (Signed)
 [place] EMERGENCY DEPARTMENT AT [facility] Provider Note   CSN: [id number] Arrival date & time: 07/26/24  2104     Patient presents with: Abdominal Pain   Kenneth Keller is a 18 y.o. male.  {Add pertinent medical, surgical, social history, OB history to YEP:67052} The history is provided by the patient.  Abdominal Pain  He has history of depression, attention deficit disorder and comes in complaining of constipation and abdominal pain for the last 5 days.  He states he started vomiting this afternoon.  He did have a bowel movement today, but pain did not improve following that.  He denies fever but has had chills and sweats.  Denies any urinary difficulty.  He has a history of cannabis hyperemesis, but states he has not been using cannabis.    Prior to Admission medications   Medication Sig Start Date End Date Taking? Authorizing Provider  acetaminophen  (TYLENOL ) 500 MG tablet Take 500-1,000 mg by mouth every 6 (six) hours as needed for moderate pain (pain score 4-6).    [provider]  cetirizine (ZYRTEC) 10 MG tablet Take 10 mg by mouth daily as needed for allergies. Patient not taking: Reported on 06/28/2024    [provider]  ibuprofen  (ADVIL ) 200 MG tablet Take 200-800 mg by mouth every 6 (six) hours as needed for moderate pain (pain score 4-6).    [provider]  OLANZapine -Samidorphan (LYBALVI ) 10-10 MG TABS Take 1 tablet by mouth daily.    [provider]  ondansetron  (ZOFRAN -ODT) 4 MG disintegrating tablet Take 1 tablet (4 mg total) by mouth every 6 (six) hours as needed for nausea or vomiting. 06/03/24   Laurence Locus, DO  pantoprazole  (PROTONIX ) 40 MG tablet Take 1 tablet (40 mg total) by mouth daily. Patient not taking: Reported on 06/28/2024 06/03/24 07/03/24  Laurence Locus, DO  promethazine  (PHENERGAN ) 25 MG suppository Place 1 suppository (25 mg total) rectally every 6 (six) hours as needed for nausea or vomiting (not responsive to  po Zofran ). Patient not taking: Reported on 06/28/2024 06/03/24   Laurence Locus, DO  sucralfate  (CARAFATE ) 1 g tablet Take 1 tablet (1 g total) by mouth 4 (four) times daily -  with meals and at bedtime. Patient not taking: Reported on 06/28/2024 06/03/24 07/03/24  Laurence Locus, DO  viloxazine ER (QELBREE ) 200 MG 24 hr capsule Take 1 capsule (200 mg total) by mouth daily. 04/19/24   Mardy Elveria DEL, NP    Allergies: Other    Review of Systems  Gastrointestinal:  Positive for abdominal pain.  All other systems reviewed and are negative.   Updated Vital Signs BP 122/82   Pulse (!) 109   Temp 98.9 F (37.2 C)   Resp 18   SpO2 100%   Physical Exam Vitals and nursing note reviewed.   18 year old male, appears uncomfortable, but is in no acute distress. Vital signs are significant for elevated heart rate. Oxygen saturation is 100%, which is normal. Head is normocephalic and atraumatic. PERRLA, EOMI. Oropharynx is clear. Lungs are clear without rales, wheezes, or rhonchi. Chest is nontender. Heart has regular rate and rhythm without murmur. Abdomen is soft, flat, with mild to moderate epigastric tenderness.  There is no rebound or guarding.  Peristalsis is diminished. Neurologic: Mental status is normal, cranial nerves are intact, moves all extremities equally.  (all labs ordered are listed, but only abnormal results are displayed) Labs Reviewed  LIPASE, BLOOD - Abnormal; Notable for the following components:  Result Value   Lipase 59 (*)    All other components within normal limits  COMPREHENSIVE METABOLIC PANEL WITH GFR - Abnormal; Notable for the following components:   Potassium 3.3 (*)    CO2 18 (*)    Glucose, Bld 132 (*)    BUN 5 (*)    Calcium  10.6 (*)    Albumin 5.2 (*)    AST 46 (*)    Anion gap 22 (*)    All other components within normal limits  CBC - Abnormal; Notable for the following components:   WBC 13.3 (*)    All other components within normal limits     EKG: None  Radiology: No results found.  {Document cardiac monitor, telemetry assessment procedure when appropriate:32947} Procedures   Medications Ordered in the ED  oxyCODONE -acetaminophen  (PERCOCET/ROXICET) 5-325 MG per tablet 1 tablet (has no administration in time range)  ondansetron  (ZOFRAN -ODT) disintegrating tablet 8 mg (has no administration in time range)      {Click here for ABCD2, HEART and other calculators REFRESH Note before signing:1}                              Medical Decision Making Amount and/or Complexity of Data Reviewed Labs: ordered.  Risk Prescription drug management.   Upper abdominal pain with vomiting.  This is a presentation with a wide range of treatment options and carries with it a high risk of morbidity and complications.  Differential diagnosis includes, but is not limited to, peptic ulcer disease, pancreatitis, cholecystitis, diverticulitis, bowel obstruction, gastroparesis.  I have reviewed his laboratory tests, and my interpretation is elevated random glucose level which will need to be followed as an outpatient, borderline elevated calcium , hypokalemia likely secondary to vomiting, metabolic acidosis likely secondary to emesis, borderline elevated AST which is not felt to be clinically significant, mildly elevated lipase which is probably not clinically significant, mild leukocytosis which is nonspecific.  I have ordered IV fluids, morphine  for pain, ondansetron  for nausea and I have ordered CT of abdomen and pelvis.  I have reviewed his past records, and note 2 recent hospitalizations for intractable vomiting with final diagnosis for most recent hospitalization of 06/27/2024 of cannabinoid hyperemesis syndrome  {Document critical care time when appropriate  Document review of labs and clinical decision tools ie CHADS2VASC2, etc  Document your independent review of radiology images and any outside records  Document your discussion with family  members, caretakers and with consultants  Document social determinants of health affecting pt's care  Document your decision making why or why not admission, treatments were needed:32947:::1}   Final diagnoses:  None    ED Discharge Orders     None

## 2024-07-27 ENCOUNTER — Emergency Department (HOSPITAL_BASED_OUTPATIENT_CLINIC_OR_DEPARTMENT_OTHER): Payer: MEDICAID

## 2024-07-27 DIAGNOSIS — R7401 Elevation of levels of liver transaminase levels: Secondary | ICD-10-CM | POA: Diagnosis not present

## 2024-07-27 DIAGNOSIS — R112 Nausea with vomiting, unspecified: Principal | ICD-10-CM | POA: Diagnosis present

## 2024-07-27 DIAGNOSIS — R739 Hyperglycemia, unspecified: Secondary | ICD-10-CM | POA: Diagnosis present

## 2024-07-27 DIAGNOSIS — E876 Hypokalemia: Secondary | ICD-10-CM | POA: Diagnosis not present

## 2024-07-27 DIAGNOSIS — Z743 Need for continuous supervision: Secondary | ICD-10-CM | POA: Diagnosis not present

## 2024-07-27 DIAGNOSIS — E872 Acidosis, unspecified: Secondary | ICD-10-CM | POA: Diagnosis present

## 2024-07-27 DIAGNOSIS — R000 Tachycardia, unspecified: Secondary | ICD-10-CM | POA: Diagnosis not present

## 2024-07-27 DIAGNOSIS — K5900 Constipation, unspecified: Secondary | ICD-10-CM | POA: Diagnosis not present

## 2024-07-27 DIAGNOSIS — R1010 Upper abdominal pain, unspecified: Secondary | ICD-10-CM | POA: Diagnosis present

## 2024-07-27 LAB — COMPREHENSIVE METABOLIC PANEL WITH GFR
ALT: 20 U/L (ref 0–44)
AST: 38 U/L (ref 15–41)
Albumin: 4.7 g/dL (ref 3.5–5.0)
Alkaline Phosphatase: 63 U/L (ref 38–126)
Anion gap: 14 (ref 5–15)
BUN: <5 mg/dL — ABNORMAL LOW (ref 6–20)
CO2: 22 mmol/L (ref 22–32)
Calcium: 9.9 mg/dL (ref 8.9–10.3)
Chloride: 107 mmol/L (ref 98–111)
Creatinine, Ser: 0.74 mg/dL (ref 0.61–1.24)
GFR, Estimated: >60 mL/min (ref >60)
Glucose, Bld: 102 mg/dL — ABNORMAL HIGH (ref 70–99)
Potassium: 3.8 mmol/L (ref 3.5–5.1)
Sodium: 143 mmol/L (ref 135–145)
Total Bilirubin: 0.6 mg/dL (ref 0.0–1.2)
Total Protein: 6.7 g/dL (ref 6.5–8.1)

## 2024-07-27 LAB — CBC
HCT: 46 % (ref 39.0–52.0)
Hemoglobin: 14.9 g/dL (ref 13.0–17.0)
MCH: 28.2 pg (ref 26.0–34.0)
MCHC: 32.4 g/dL (ref 30.0–36.0)
MCV: 87 fL (ref 80.0–100.0)
Platelets: 349 K/uL (ref 150–400)
RBC: 5.29 MIL/uL (ref 4.22–5.81)
RDW: 12.6 % (ref 11.5–15.5)
WBC: 10.6 K/uL — ABNORMAL HIGH (ref 4.0–10.5)
nRBC: 0 % (ref 0.0–0.2)

## 2024-07-27 LAB — MAGNESIUM
Magnesium: 1.9 mg/dL (ref 1.7–2.4)
Magnesium: 2.2 mg/dL (ref 1.7–2.4)

## 2024-07-27 LAB — LIPASE, BLOOD: Lipase: 25 U/L (ref 11–51)

## 2024-07-27 MED ORDER — SODIUM CHLORIDE 0.9 % IV SOLN: INTRAVENOUS | Status: DC

## 2024-07-27 MED ORDER — MORPHINE SULFATE (PF) 4 MG/ML IV SOLN
4.0000 mg | Freq: Once | INTRAVENOUS | Status: AC
  Administered 2024-07-27: 4 mg via INTRAVENOUS
  Filled 2024-07-27: qty 1

## 2024-07-27 MED ORDER — DROPERIDOL 2.5 MG/ML IJ SOLN
1.2500 mg | Freq: Once | INTRAMUSCULAR | Status: AC
  Administered 2024-07-27: 1.25 mg via INTRAVENOUS
  Filled 2024-07-27: qty 2

## 2024-07-27 MED ORDER — POTASSIUM CHLORIDE CRYS ER 20 MEQ PO TBCR
20.0000 meq | EXTENDED_RELEASE_TABLET | Freq: Once | ORAL | Status: AC
  Administered 2024-07-27: 20 meq via ORAL
  Filled 2024-07-27: qty 1

## 2024-07-27 MED ORDER — POLYETHYLENE GLYCOL 3350 17 G PO PACK
17.0000 g | PACK | Freq: Every day | ORAL | Status: DC
  Administered 2024-07-27: 17 g via ORAL
  Filled 2024-07-27: qty 1

## 2024-07-27 MED ORDER — VILOXAZINE HCL ER 200 MG PO CP24: 200.0000 mg | ORAL_CAPSULE | Freq: Every day | ORAL | Status: DC

## 2024-07-27 MED ORDER — PANTOPRAZOLE SODIUM 40 MG IV SOLR
40.0000 mg | Freq: Once | INTRAVENOUS | Status: AC
  Administered 2024-07-27: 40 mg via INTRAVENOUS
  Filled 2024-07-27: qty 10

## 2024-07-27 MED ORDER — OLANZAPINE-SAMIDORPHAN 10-10 MG PO TABS: 1.0000 | ORAL_TABLET | Freq: Every day | ORAL | Status: DC

## 2024-07-27 MED ORDER — ALUM & MAG HYDROXIDE-SIMETH 200-200-20 MG/5ML PO SUSP
30.0000 mL | Freq: Once | ORAL | Status: AC
  Administered 2024-07-27: 30 mL via ORAL
  Filled 2024-07-27: qty 30

## 2024-07-27 MED ORDER — DIPHENHYDRAMINE HCL 50 MG/ML IJ SOLN
25.0000 mg | Freq: Once | INTRAMUSCULAR | Status: AC
  Administered 2024-07-27: 25 mg via INTRAVENOUS
  Filled 2024-07-27: qty 1

## 2024-07-27 MED ORDER — PROCHLORPERAZINE EDISYLATE 10 MG/2ML IJ SOLN
10.0000 mg | INTRAMUSCULAR | Status: DC | PRN
  Administered 2024-07-27 – 2024-07-28 (×4): 10 mg via INTRAVENOUS
  Filled 2024-07-27 (×4): qty 2

## 2024-07-27 MED ORDER — ACETAMINOPHEN 325 MG PO TABS
650.0000 mg | ORAL_TABLET | Freq: Four times a day (QID) | ORAL | Status: DC | PRN
  Filled 2024-07-27: qty 2

## 2024-07-27 MED ORDER — ACETAMINOPHEN 650 MG RE SUPP: 650.0000 mg | Freq: Four times a day (QID) | RECTAL | Status: DC | PRN

## 2024-07-27 MED ORDER — PROCHLORPERAZINE EDISYLATE 10 MG/2ML IJ SOLN
10.0000 mg | Freq: Once | INTRAMUSCULAR | Status: AC
  Administered 2024-07-27: 10 mg via INTRAVENOUS
  Filled 2024-07-27: qty 2

## 2024-07-27 MED ORDER — MELATONIN 5 MG PO TABS
5.0000 mg | ORAL_TABLET | Freq: Every evening | ORAL | Status: DC | PRN
  Administered 2024-07-27: 5 mg via ORAL
  Filled 2024-07-27: qty 1

## 2024-07-27 MED ORDER — HYDROMORPHONE HCL 1 MG/ML IJ SOLN
1.0000 mg | Freq: Once | INTRAMUSCULAR | Status: AC
  Administered 2024-07-27: 1 mg via INTRAVENOUS
  Filled 2024-07-27: qty 1

## 2024-07-27 MED ORDER — SODIUM CHLORIDE 0.9 % IV BOLUS
1000.0000 mL | Freq: Once | INTRAVENOUS | Status: AC
Start: 2024-07-27 — End: 2024-07-28
  Administered 2024-07-27: 1000 mL via INTRAVENOUS

## 2024-07-27 MED ORDER — ACETAMINOPHEN 500 MG PO TABS
1000.0000 mg | ORAL_TABLET | Freq: Once | ORAL | Status: AC
  Administered 2024-07-27: 1000 mg via ORAL
  Filled 2024-07-27: qty 2

## 2024-07-27 MED ORDER — SODIUM CHLORIDE 0.9 % IV BOLUS
1000.0000 mL | Freq: Once | INTRAVENOUS | Status: AC
  Administered 2024-07-27: 1000 mL via INTRAVENOUS

## 2024-07-27 MED ORDER — DROPERIDOL 2.5 MG/ML IJ SOLN
2.5000 mg | Freq: Once | INTRAMUSCULAR | Status: AC
  Administered 2024-07-27: 2.5 mg via INTRAVENOUS
  Filled 2024-07-27: qty 2

## 2024-07-27 MED ORDER — PANTOPRAZOLE SODIUM 40 MG IV SOLR
40.0000 mg | Freq: Two times a day (BID) | INTRAVENOUS | Status: DC
  Administered 2024-07-27 – 2024-07-29 (×4): 40 mg via INTRAVENOUS
  Filled 2024-07-27 (×4): qty 10

## 2024-07-27 MED ORDER — ENOXAPARIN SODIUM 40 MG/0.4ML IJ SOSY
40.0000 mg | PREFILLED_SYRINGE | Freq: Every day | INTRAMUSCULAR | Status: DC
Start: 2024-07-27 — End: 2024-07-29
  Administered 2024-07-27 – 2024-07-28 (×2): 40 mg via SUBCUTANEOUS
  Filled 2024-07-27 (×2): qty 0.4

## 2024-07-27 MED ORDER — HALOPERIDOL LACTATE 5 MG/ML IJ SOLN
5.0000 mg | Freq: Once | INTRAMUSCULAR | Status: AC
  Administered 2024-07-27: 5 mg via INTRAVENOUS
  Filled 2024-07-27: qty 1

## 2024-07-27 NOTE — ED Notes (Signed)
 Water given for PO challenge.

## 2024-07-27 NOTE — Plan of Care (Signed)
 Only mild nausea and no vomiting since admission to unit.

## 2024-07-27 NOTE — ED Notes (Signed)
 Kenneth Keller [phone]

## 2024-07-27 NOTE — ED Notes (Signed)
 Notified provider about temp 100.1. HR 120s. Notified this nurse that he takes anxiety medications and had not taken his medication today.

## 2024-07-27 NOTE — Progress Notes (Signed)
 Hospitalist Transfer Note:    Nursing staff, Please call [facility] Admits & Consults System-Wide number on Amion [phone]) as soon as patient's arrival, so appropriate admitting provider can evaluate the pt.   Transferring facility: [facility] Requesting provider: Dr. Raford (EDP at [facility]) Reason for transfer: admission for further evaluation and management of intractable nausea/vomiting.    18 year old male, who presented to [facility] ED complaining of persistent nausea/vomiting over the last few days associated with some generalized abdominal discomfort.  In the setting, he has been unable to tolerate p.o. over the last few days, and his nausea/vomiting have been refractory to multiple doses of IV antiemetics administered in the ED this evening, including doses of IV Zofran , Compazine , and droperidol .  He is reported to have had a similar hospitalization in July 2025, which was felt to be on the basis of hyperemesis cannabinoid syndrome.  Vital signs in the ED were notable for the following: Afebrile; initial heart rates in the low 100s, subsequently decreasing into the 90s following interval IV fluids.  Imaging notable for CT abdomen/pelvis with contrast, which is reported to show no evidence of acute process.  Medications administered prior to transfer included the following: In addition to the above antiemetics, he is also received doses of IV Dilaudid , IV morphine , continues to report abdominal discomfort refractory to these interventions.   Subsequently, I accepted this patient for transfer for observation to a med/tele bed at [facility] or [facility]  (first available) for further work-up and management of the above.       Eva Pore, DO Hospitalist

## 2024-07-27 NOTE — ED Notes (Signed)
 Patient has kept his jello and sprite down. He is requesting additional pain and nausea medications. Provided notified.

## 2024-07-27 NOTE — H&P (Signed)
 [facility] History and Physical  Kenneth Keller FMW:[id number] DOB: [birth year] DOA: 07/26/2024   PCP: Kenneth Ped, MD  Specialists: Followed by a psychiatrist  Chief Complaint: Nausea and vomiting and constipation  HPI: Kenneth Keller is a 18 y.o. male with past medical history of cannabinoid hyperemesis syndrome, anxiety and depression, who was last hospitalized for intractable nausea and vomiting in August.  He was also hospitalized for similar concern in July.  He mentions that about a week ago he was at [facility] where he consumed a lot of marijuana products.  But since then he has not had any.  He denies frequent alcohol use.  Denies any recreational drug use.  He states that he has been constipated for a week.  He came into the emergency department yesterday with complaints of nausea vomiting abdominal pain.  He took a lot of laxatives prior to coming to the emergency department and had a bowel movement.  He was given symptomatic treatment in the ED however he did not improve and so he was hospitalized for further management.  Currently he denies any abdominal pain.  He states that he is feeling better compared to last night.  His last emesis was around 3 AM this morning.  Feels hungry currently.  In the emergency department he was noted to be afebrile.  Noted to have elevated heart rate with normal blood pressures.  Found to have a WBC of 13.3.  Potassium of 3.3.  Creatinine 1.04.  AST 46.  Lipase level 59.  CT of the abdomen pelvis did not show any acute findings.  Home Medications: Prior to Admission medications   Medication Sig Start Date End Date Taking? Authorizing Provider  acetaminophen  (TYLENOL ) 500 MG tablet Take 500-1,000 mg by mouth every 6 (six) hours as needed for moderate pain (pain score 4-6).    [provider]  cetirizine (ZYRTEC) 10 MG tablet Take 10 mg by mouth daily as needed for allergies. Patient not taking: Reported on 06/28/2024    [provider]  ibuprofen  (ADVIL ) 200 MG tablet Take 200-800 mg by mouth every 6 (six) hours as needed for moderate pain (pain score 4-6).    [provider]  OLANZapine -Samidorphan (LYBALVI ) 10-10 MG TABS Take 1 tablet by mouth daily.    [provider]  ondansetron  (ZOFRAN -ODT) 4 MG disintegrating tablet Take 1 tablet (4 mg total) by mouth every 6 (six) hours as needed for nausea or vomiting. 06/03/24   Laurence Locus, DO  pantoprazole  (PROTONIX ) 40 MG tablet Take 1 tablet (40 mg total) by mouth daily. Patient not taking: Reported on 06/28/2024 06/03/24 07/03/24  Laurence Locus, DO  promethazine  (PHENERGAN ) 25 MG suppository Place 1 suppository (25 mg total) rectally every 6 (six) hours as needed for nausea or vomiting (not responsive to po Zofran ). Patient not taking: Reported on 06/28/2024 06/03/24   Laurence Locus, DO  sucralfate  (CARAFATE ) 1 g tablet Take 1 tablet (1 g total) by mouth 4 (four) times daily -  with meals and at bedtime. Patient not taking: Reported on 06/28/2024 06/03/24 07/03/24  Laurence Locus, DO  viloxazine ER (QELBREE ) 200 MG 24 hr capsule Take 1 capsule (200 mg total) by mouth daily. 04/19/24   Mardy Elveria DEL, NP    Allergies:  Allergies  Allergen Reactions   Other Other (See Comments)    Pet dander and seasonal allergies (exposure results in allergic symptoms)    Past Medical History: Past Medical History:  Diagnosis Date   ADHD  Anxiety    Kidney stones    MDD (major depressive disorder)    Medical history non-contributory    Self-mutilation     Past Surgical History:  Procedure Laterality Date   CLOSED REDUCTION FIBULA Right 01/28/2017   Procedure: CLOSED REDUCTION TIBIA/FIBULA  WITH CASTING;  Surgeon: Ozell Bruch, MD;  Location: [facility] OR;  Service: Orthopedics;  Laterality: Right;   CLOSED REDUCTION FINGER WITH PERCUTANEOUS PINNING Right 05/03/2022   Procedure: CLOSED REDUCTION PERCUTANEOUS PINNING RIGHT SMALL METACARPAL FRACTURE AND RIGHT RING FINGER  METACARPAL;  Surgeon: Murrell Drivers, MD;  Location: [facility];  Service: Orthopedics;  Laterality: Right;   CLOSED REDUCTION METACARPAL WITH PERCUTANEOUS PINNING Left 03/24/2021   Procedure: CLOSED REDUCTION METACARPAL WITH PERCUTANEOUS PINNING;  Surgeon: Murrell Drivers, MD;  Location: [facility];  Service: Orthopedics;  Laterality: Left;   CLOSED REDUCTION RADIAL SHAFT Right 02/12/2021   Procedure: CLOSED REDUCTION RADIAL DISTAL FRACTURE,;  Surgeon: Murrell Drivers, MD;  Location: [facility] OR;  Service: Orthopedics;  Laterality: Right;    Social History: Lives in [place].  Vapes.  Marijuana use as discussed above.  No other illicit drug use.  Rare alcohol use.   Family History:  Family History  Problem Relation Age of Onset   Heart failure Mother    Heart failure Father    Hypertension Father      Review of Systems - History obtained from the patient General ROS: positive for  - fatigue Psychological ROS: positive for - anxiety Ophthalmic ROS: negative ENT ROS: negative Allergy and Immunology ROS: negative Hematological and Lymphatic ROS: negative Endocrine ROS: negative Respiratory ROS: no cough, shortness of breath, or wheezing Cardiovascular ROS: no chest pain or dyspnea on exertion Gastrointestinal ROS: As in HPI Genito-Urinary ROS: no dysuria, trouble voiding, or hematuria Musculoskeletal ROS: negative Neurological ROS: no TIA or stroke symptoms Dermatological ROS: negative  Physical Examination  Vitals:   07/27/24 1345 07/27/24 1429 07/27/24 1430 07/27/24 1623  BP:   (!) 136/90 129/85  Pulse:   (!) 123 95  Resp: 14  16 16   Temp:  100.1 F (37.8 C)  98.6 F (37 C)  TempSrc:  Oral  Oral  SpO2:   98% 96%    BP 129/85 (BP Location: Right Arm)   Pulse 95   Temp 98.6 F (37 C) (Oral)   Resp 16   SpO2 96%   General appearance: alert, cooperative, appears stated age, and no distress Head: Normocephalic, without obvious abnormality,  atraumatic Eyes: conjunctivae/corneas clear. PERRL, EOM's intact.  Throat: lips, mucosa, and tongue normal; teeth and gums normal Neck: no adenopathy, no carotid bruit, no JVD, supple, symmetrical, trachea midline, and thyroid not enlarged, symmetric, no tenderness/mass/nodules Resp: clear to auscultation bilaterally Cardio: regular rate and rhythm, S1, S2 normal, no murmur, click, rub or gallop GI: Abdomen soft.  Mildly tender in the epigastric area without any rebound rigidity or guarding.  No masses organomegaly.  Bowel sounds are present.   Extremities: extremities normal, atraumatic, no cyanosis or edema Pulses: 2+ and symmetric Skin: Skin color, texture, turgor normal. No rashes or lesions Lymph nodes: Cervical, supraclavicular, and axillary nodes normal. Neurologic: Alert and oriented x 3.  Cranial nerves II to XII intact.  Motor strength equal bilateral upper and lower extremities.   Labs on Admission: I have personally reviewed following labs and imaging studies  CBC: Recent Labs  Lab 07/26/24 2117  WBC 13.3*  HGB 15.6  HCT 45.2  MCV 83.7  PLT 380  Basic Metabolic Panel: Recent Labs  Lab 07/26/24 2117 07/26/24 2119  NA 140  --   K 3.3*  --   CL 101  --   CO2 18*  --   GLUCOSE 132*  --   BUN 5*  --   CREATININE 1.04  --   CALCIUM  10.6*  --   MG  --  1.9   GFR: CrCl cannot be calculated (Unknown ideal weight.). Liver Function Tests: Recent Labs  Lab 07/26/24 2117  AST 46*  ALT 20  ALKPHOS 71  BILITOT 0.4  PROT 7.7  ALBUMIN 5.2*   Recent Labs  Lab 07/26/24 2117  LIPASE 59*     Radiological Exams on Admission: CT ABDOMEN PELVIS W CONTRAST Result Date: 07/27/2024 CLINICAL DATA:  Left upper quadrant pain, nausea, vomiting EXAM: CT ABDOMEN AND PELVIS WITH CONTRAST TECHNIQUE: Multidetector CT imaging of the abdomen and pelvis was performed using the standard protocol following bolus administration of intravenous contrast. RADIATION DOSE REDUCTION: This  exam was performed according to the departmental dose-optimization program which includes automated exposure control, adjustment of the mA and/or kV according to patient size and/or use of iterative reconstruction technique. CONTRAST:  75mL OMNIPAQUE  IOHEXOL  300 MG/ML  SOLN COMPARISON:  06/01/2024 FINDINGS: Lower chest: No acute findings Hepatobiliary: No focal hepatic abnormality. Gallbladder unremarkable. Pancreas: No focal abnormality or ductal dilatation. Spleen: No focal abnormality.  Normal size. Adrenals/Urinary Tract: No adrenal abnormality. No focal renal abnormality. No stones or hydronephrosis. Urinary bladder is unremarkable. Stomach/Bowel: Normal appendix. Stomach, large and small bowel grossly unremarkable. Vascular/Lymphatic: No evidence of aneurysm or adenopathy. Reproductive: No visible focal abnormality. Other: No free fluid or free air. Musculoskeletal: No acute bony abnormality. IMPRESSION: No acute findings in the abdomen or pelvis. Electronically Signed   By: Franky Crease M.D.   On: 07/27/2024 00:49    My interpretation of Electrocardiogram: Sinus rhythm in the 90s.  Normal axis.  Intervals appear to be normal.  No concerning ST or T wave changes.   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  He has a previous history of same and has been diagnosed previously with cannabis hyperemesis syndrome.  His last cannabis use was last week.  He also has been constipated.  His nausea vomiting likely due to combination of constipation and cannabis use.  With symptomatic treatment he has improved.  He took laxatives prior to coming to the hospital and did have  a bowel movement yesterday.  Plan: Intractable nausea and vomiting: Multifactorial as discussed above.  Symptomatic treatment will be continued.  IV fluids.  He does feel better this evening.  He will be given full liquid diet.  Advance as tolerated.  Constipation: Had a bowel movement prior to arrival.  Hypokalemia: Unclear if he was supplemented last night.  Labs will be repeated.  Check magnesium  level.  Elevated lipase and mildly elevated AST: Unclear significance.  Will repeat these levels.  Leukocytosis: Most likely reactive.  No obvious source of infection identified.  Will recheck labs.  Mild hypercalcemia: Most likely due to hypovolemia.  Will recheck labs.  Previous history of QT prolonging agent: None noted on current EKG.  Suspected QT prolongation was most  likely due to electrolyte imbalance previously.  Sinus tachycardia: Most likely due to combination of hypovolemia and anxiety.  He mentions that he gets extremely anxious and new settings.  Monitor on telemetry for now.  Check TSH in the morning.  History of depression and anxiety: Continue with the psychotropic medications.   DVT Prophylaxis: Lovenox  Code Status: Full code Family Communication: Discussed with patient Disposition: Hopefully home when improved Consults called: None Admission Status: Status is: Observation The patient remains OBS appropriate and will d/c before 2 midnights.    Severity of Illness: The appropriate patient status for this patient is OBSERVATION. Observation status is judged to be reasonable and necessary in order to provide the required intensity of service to ensure the patient's safety. The patient's presenting symptoms, physical exam findings, and initial radiographic and laboratory data in the context of their medical condition is felt to place them at decreased risk for further clinical deterioration. Furthermore, it is anticipated that the patient will be medically stable for  discharge from the hospital within 2 midnights of admission.    Further management decisions will depend on results of further testing and patient's response to treatment.   Kenneth Keller  [facility] Pager on [email or web address]  07/27/2024, 4:56 PM

## 2024-07-27 NOTE — Procedures (Signed)
 Called [facility] to transport the patient to [facility] rm# [phone]

## 2024-07-27 NOTE — Plan of Care (Signed)
  Problem: Education: Goal: Knowledge of General Education information will improve Description: Including pain rating scale, medication(s)/side effects and non-pharmacologic comfort measures Outcome: Progressing   Problem: Clinical Measurements: Goal: Ability to maintain clinical measurements within normal limits will improve Outcome: Progressing   Problem: Coping: Goal: Level of anxiety will decrease Outcome: Progressing   

## 2024-07-27 NOTE — ED Notes (Signed)
 NS IV fluids started. Patient provided with sprite, jello, and applesauce at this time.

## 2024-07-27 NOTE — ED Notes (Signed)
 Patient is requesting to eat/drink and reports no nausea at this time. Also his heart rate shot up to 142 after standing up and remains tachycardic at 109. At rest he was 75. Provider notified.

## 2024-07-28 ENCOUNTER — Observation Stay (HOSPITAL_COMMUNITY): Payer: MEDICAID

## 2024-07-28 DIAGNOSIS — Z91048 Other nonmedicinal substance allergy status: Secondary | ICD-10-CM | POA: Diagnosis not present

## 2024-07-28 DIAGNOSIS — Z79899 Other long term (current) drug therapy: Secondary | ICD-10-CM | POA: Diagnosis not present

## 2024-07-28 DIAGNOSIS — R000 Tachycardia, unspecified: Secondary | ICD-10-CM | POA: Diagnosis present

## 2024-07-28 DIAGNOSIS — F17290 Nicotine dependence, other tobacco product, uncomplicated: Secondary | ICD-10-CM | POA: Diagnosis present

## 2024-07-28 DIAGNOSIS — R7401 Elevation of levels of liver transaminase levels: Secondary | ICD-10-CM | POA: Diagnosis present

## 2024-07-28 DIAGNOSIS — F339 Major depressive disorder, recurrent, unspecified: Secondary | ICD-10-CM | POA: Diagnosis present

## 2024-07-28 DIAGNOSIS — F1210 Cannabis abuse, uncomplicated: Secondary | ICD-10-CM | POA: Diagnosis present

## 2024-07-28 DIAGNOSIS — E876 Hypokalemia: Secondary | ICD-10-CM | POA: Diagnosis present

## 2024-07-28 DIAGNOSIS — D72829 Elevated white blood cell count, unspecified: Secondary | ICD-10-CM | POA: Diagnosis present

## 2024-07-28 DIAGNOSIS — F419 Anxiety disorder, unspecified: Secondary | ICD-10-CM | POA: Diagnosis present

## 2024-07-28 DIAGNOSIS — R112 Nausea with vomiting, unspecified: Secondary | ICD-10-CM | POA: Diagnosis present

## 2024-07-28 DIAGNOSIS — E872 Acidosis, unspecified: Secondary | ICD-10-CM | POA: Diagnosis present

## 2024-07-28 DIAGNOSIS — E861 Hypovolemia: Secondary | ICD-10-CM | POA: Diagnosis present

## 2024-07-28 DIAGNOSIS — K5900 Constipation, unspecified: Secondary | ICD-10-CM | POA: Diagnosis present

## 2024-07-28 DIAGNOSIS — Z8249 Family history of ischemic heart disease and other diseases of the circulatory system: Secondary | ICD-10-CM | POA: Diagnosis not present

## 2024-07-28 DIAGNOSIS — R9431 Abnormal electrocardiogram [ECG] [EKG]: Secondary | ICD-10-CM | POA: Diagnosis present

## 2024-07-28 LAB — COMPREHENSIVE METABOLIC PANEL WITH GFR
ALT: 20 U/L (ref 0–44)
AST: 37 U/L (ref 15–41)
Albumin: 4.7 g/dL (ref 3.5–5.0)
Alkaline Phosphatase: 63 U/L (ref 38–126)
Anion gap: 14 (ref 5–15)
BUN: 5 mg/dL — ABNORMAL LOW (ref 6–20)
CO2: 20 mmol/L — ABNORMAL LOW (ref 22–32)
Calcium: 9.7 mg/dL (ref 8.9–10.3)
Chloride: 105 mmol/L (ref 98–111)
Creatinine, Ser: 0.76 mg/dL (ref 0.61–1.24)
GFR, Estimated: >60 mL/min (ref >60)
Glucose, Bld: 114 mg/dL — ABNORMAL HIGH (ref 70–99)
Potassium: 3.7 mmol/L (ref 3.5–5.1)
Sodium: 140 mmol/L (ref 135–145)
Total Bilirubin: 0.6 mg/dL (ref 0.0–1.2)
Total Protein: 6.9 g/dL (ref 6.5–8.1)

## 2024-07-28 LAB — CBC
HCT: 46.3 % (ref 39.0–52.0)
Hemoglobin: 15.4 g/dL (ref 13.0–17.0)
MCH: 28.9 pg (ref 26.0–34.0)
MCHC: 33.3 g/dL (ref 30.0–36.0)
MCV: 87 fL (ref 80.0–100.0)
Platelets: 312 K/uL (ref 150–400)
RBC: 5.32 MIL/uL (ref 4.22–5.81)
RDW: 12.6 % (ref 11.5–15.5)
WBC: 10.5 K/uL (ref 4.0–10.5)
nRBC: 0 % (ref 0.0–0.2)

## 2024-07-28 LAB — TSH: TSH: 0.469 u[IU]/mL (ref 0.350–4.500)

## 2024-07-28 LAB — MAGNESIUM: Magnesium: 2.4 mg/dL (ref 1.7–2.4)

## 2024-07-28 MED ORDER — HYDROMORPHONE HCL 1 MG/ML IJ SOLN
0.5000 mg | Freq: Once | INTRAMUSCULAR | Status: AC
  Administered 2024-07-28: 0.5 mg via INTRAVENOUS
  Filled 2024-07-28: qty 0.5

## 2024-07-28 MED ORDER — HYDROMORPHONE HCL 1 MG/ML IJ SOLN
0.5000 mg | Freq: Once | INTRAMUSCULAR | Status: AC
  Administered 2024-07-28: 0.5 mg via INTRAVENOUS
  Filled 2024-07-28 (×2): qty 0.5

## 2024-07-28 MED ORDER — SUCRALFATE 1 GM/10ML PO SUSP
1.0000 g | Freq: Three times a day (TID) | ORAL | Status: DC
  Administered 2024-07-28 – 2024-07-29 (×4): 1 g via ORAL
  Filled 2024-07-28 (×4): qty 10

## 2024-07-28 MED ORDER — HALOPERIDOL LACTATE 5 MG/ML IJ SOLN
1.0000 mg | Freq: Once | INTRAMUSCULAR | Status: AC
  Administered 2024-07-28: 1 mg via INTRAVENOUS
  Filled 2024-07-28: qty 1

## 2024-07-28 MED ORDER — SODIUM CHLORIDE 0.9 % IV SOLN: INTRAVENOUS | Status: AC

## 2024-07-28 MED ORDER — ALPRAZOLAM 0.5 MG PO TABS
0.5000 mg | ORAL_TABLET | Freq: Two times a day (BID) | ORAL | Status: DC | PRN
  Administered 2024-07-28: 0.5 mg via ORAL
  Filled 2024-07-28: qty 1

## 2024-07-28 MED ORDER — SODIUM CHLORIDE 0.9 % IV SOLN
12.5000 mg | Freq: Four times a day (QID) | INTRAVENOUS | Status: DC | PRN
  Filled 2024-07-28: qty 0.5

## 2024-07-28 MED ORDER — DICYCLOMINE HCL 10 MG PO CAPS
10.0000 mg | ORAL_CAPSULE | Freq: Three times a day (TID) | ORAL | Status: DC
  Administered 2024-07-28 – 2024-07-29 (×3): 10 mg via ORAL
  Filled 2024-07-28 (×3): qty 1

## 2024-07-28 NOTE — Progress Notes (Signed)
 [facility] PROGRESS NOTE   Kenneth Keller FMW:[id number] DOB: [birth year] DOA: 07/26/2024  PCP: Dorothyann Ped, MD  Brief History: 18 y.o. male with past medical history of cannabinoid hyperemesis syndrome, anxiety and depression, who was last hospitalized for intractable nausea and vomiting in August.  He was also hospitalized for similar concern in July.  He mentions that about a week ago he was at [facility] where he consumed a lot of marijuana products.  But since then he has not had any.  He denies frequent alcohol use.  Denies any recreational drug use.  He states that he has been constipated for a week.  He came into the emergency department with complaints of nausea vomiting abdominal pain.  Despite treatment in the emergency department he did not improve.  So he was hospitalized for further management.    Consultants: None  Procedures: None    Subjective/Interval History: Patient had 1 episode of vomiting last night.  Denies any abdominal pain chest pain shortness of breath this morning.  Wants to go home later today if possible.  He is still on full liquid diet.    Assessment/Plan:  Intractable nausea and vomiting Etiology is likely multifactorial including cannabis use and constipation.  He did have a bowel movement prior to admission at home after he took a lot of laxatives. CT scan of the abdomen pelvis was reassuringly normal. Seems to be doing better but did have an episode of emesis last night.  Will see how he does with liquids this morning.  If he does okay then we could advance him to soft diet later today. Continue IV fluids for now.     Constipation Had a bowel movement prior to arrival to the ED after several days of being constipated.  TSH is noted to be normal.   Hypokalemia Potassium levels have improved.  Magnesium  2.4.   Elevated lipase and mildly elevated AST Unclear significance.  Now normal.     Leukocytosis Most likely reactive.  No  obvious source of infection identified.  Now resolved   Mild hypercalcemia Most likely due to hypovolemia.  Resolved.   Previous history of QT prolonging agent None noted on current EKG.  Suspected QT prolongation was most likely due to electrolyte imbalance previously.   Sinus tachycardia Patient mentions that he gets very tachycardic when he is in the hospital.  Previous notes reviewed.  Never has had dismissed tachycardia previously.  Current heart rate noted to go into the 140s.  EKG at the time of admission shows normal sinus rhythm. Will repeat EKG today.  Continue to monitor on telemetry. Anxiety is also contributing to his tachycardia.   History of depression and anxiety Continue with the psychotropic medications.   DVT Prophylaxis: Lovenox  Code Status: Full code Family Communication: Discussed with patient Disposition Plan: Hopefully return home when improved     Medications: Scheduled:  enoxaparin  (LOVENOX ) injection  40 mg Subcutaneous QHS   OLANZapine -Samidorphan  1 tablet Oral Daily   pantoprazole  (PROTONIX ) IV  40 mg Intravenous Q12H   polyethylene glycol  17 g Oral Daily   viloxazine ER  200 mg Oral Daily   Continuous:  sodium chloride  75 mL/hr at 07/27/24 1741   PRN:acetaminophen  **OR** acetaminophen , melatonin, prochlorperazine   Antibiotics: Anti-infectives (From admission, onward)    None       Objective:  Vital Signs  Vitals:   07/27/24 1430 07/27/24 1623 07/27/24 2025 07/28/24 0453  BP: (!) 136/90 129/85 132/79 136/82  Pulse: (!) 123  95    Resp: 16 16 16 18   Temp:  98.6 F (37 C) 98.5 F (36.9 C) 98.9 F (37.2 C)  TempSrc:  Oral Oral Oral  SpO2: 98% 96% 98% 97%    Intake/Output Summary (Last 24 hours) at 07/28/2024 0813 Last data filed at 07/28/2024 0300 Gross per 24 hour  Intake 1816.53 ml  Output 500 ml  Net 1316.53 ml   There were no vitals filed for this visit.  General appearance: Awake alert.  In no distress Resp: Clear to  auscultation bilaterally.  Normal effort Cardio: S1-S2 is tachycardic regular.  No S3-S4.  No rubs murmurs or bruit. GI: Abdomen is soft.  Nontender nondistended.  Bowel sounds are present normal.  No masses organomegaly Extremities: No edema.  Full range of motion of lower extremities. Neurologic: Alert and oriented x3.  No focal neurological deficits.    Lab Results:  Data Reviewed: I have personally reviewed following labs and reports of the imaging studies  CBC: Recent Labs  Lab 07/26/24 2117 07/27/24 1707 07/28/24 0408  WBC 13.3* 10.6* 10.5  HGB 15.6 14.9 15.4  HCT 45.2 46.0 46.3  MCV 83.7 87.0 87.0  PLT 380 349 312    Basic Metabolic Panel: Recent Labs  Lab 07/26/24 2117 07/26/24 2119 07/27/24 1707 07/28/24 0408  NA 140  --  143 140  K 3.3*  --  3.8 3.7  CL 101  --  107 105  CO2 18*  --  22 20*  GLUCOSE 132*  --  102* 114*  BUN 5*  --  <5* 5*  CREATININE 1.04  --  0.74 0.76  CALCIUM  10.6*  --  9.9 9.7  MG  --  1.9 2.2 2.4    GFR: CrCl cannot be calculated (Unknown ideal weight.).  Liver Function Tests: Recent Labs  Lab 07/26/24 2117 07/27/24 1707 07/28/24 0408  AST 46* 38 37  ALT 20 20 20   ALKPHOS 71 63 63  BILITOT 0.4 0.6 0.6  PROT 7.7 6.7 6.9  ALBUMIN 5.2* 4.7 4.7    Recent Labs  Lab 07/26/24 2117 07/27/24 1707  LIPASE 59* 25    Thyroid Function Tests: Recent Labs    07/28/24 0408  TSH 0.469    Radiology Studies: CT ABDOMEN PELVIS W CONTRAST Result Date: 07/27/2024 CLINICAL DATA:  Left upper quadrant pain, nausea, vomiting EXAM: CT ABDOMEN AND PELVIS WITH CONTRAST TECHNIQUE: Multidetector CT imaging of the abdomen and pelvis was performed using the standard protocol following bolus administration of intravenous contrast. RADIATION DOSE REDUCTION: This exam was performed according to the departmental dose-optimization program which includes automated exposure control, adjustment of the mA and/or kV according to patient size and/or use  of iterative reconstruction technique. CONTRAST:  75mL OMNIPAQUE  IOHEXOL  300 MG/ML  SOLN COMPARISON:  06/01/2024 FINDINGS: Lower chest: No acute findings Hepatobiliary: No focal hepatic abnormality. Gallbladder unremarkable. Pancreas: No focal abnormality or ductal dilatation. Spleen: No focal abnormality.  Normal size. Adrenals/Urinary Tract: No adrenal abnormality. No focal renal abnormality. No stones or hydronephrosis. Urinary bladder is unremarkable. Stomach/Bowel: Normal appendix. Stomach, large and small bowel grossly unremarkable. Vascular/Lymphatic: No evidence of aneurysm or adenopathy. Reproductive: No visible focal abnormality. Other: No free fluid or free air. Musculoskeletal: No acute bony abnormality. IMPRESSION: No acute findings in the abdomen or pelvis. Electronically Signed   By: Franky Crease M.D.   On: 07/27/2024 00:49       LOS: 0 days   Kenneth Keller  [facility] Pager on [email or web address]  07/28/2024, 8:13 AM

## 2024-07-28 NOTE — Plan of Care (Signed)
 Patient with significant nausea/vomiting after full liquid breakfast (consisting mostly of dairy products).  Improved after Compazine  and Bentyl .  Some abdominal pain after eating soft foods for supper but as of end of shift no further vomiting.

## 2024-07-29 ENCOUNTER — Other Ambulatory Visit (HOSPITAL_COMMUNITY): Payer: MEDICAID

## 2024-07-29 DIAGNOSIS — R112 Nausea with vomiting, unspecified: Secondary | ICD-10-CM | POA: Diagnosis not present

## 2024-07-29 LAB — BASIC METABOLIC PANEL WITH GFR
Anion gap: 14 (ref 5–15)
BUN: 6 mg/dL (ref 6–20)
CO2: 22 mmol/L (ref 22–32)
Calcium: 9.7 mg/dL (ref 8.9–10.3)
Chloride: 103 mmol/L (ref 98–111)
Creatinine, Ser: 0.74 mg/dL (ref 0.61–1.24)
GFR, Estimated: >60 mL/min (ref >60)
Glucose, Bld: 104 mg/dL — ABNORMAL HIGH (ref 70–99)
Potassium: 3.2 mmol/L — ABNORMAL LOW (ref 3.5–5.1)
Sodium: 138 mmol/L (ref 135–145)

## 2024-07-29 MED ORDER — SUCRALFATE 1 G PO TABS: 1.0000 g | ORAL_TABLET | Freq: Three times a day (TID) | ORAL | 0 refills | Status: AC

## 2024-07-29 MED ORDER — POLYETHYLENE GLYCOL 3350 17 G PO PACK: 17.0000 g | PACK | Freq: Every day | ORAL | 0 refills | Status: AC

## 2024-07-29 MED ORDER — POTASSIUM CHLORIDE CRYS ER 20 MEQ PO TBCR: 20.0000 meq | EXTENDED_RELEASE_TABLET | Freq: Every day | ORAL | 0 refills | Status: AC

## 2024-07-29 MED ORDER — PANTOPRAZOLE SODIUM 40 MG PO TBEC: 40.0000 mg | DELAYED_RELEASE_TABLET | Freq: Every day | ORAL | 0 refills | Status: AC

## 2024-07-29 MED ORDER — DICYCLOMINE HCL 10 MG PO CAPS: 10.0000 mg | ORAL_CAPSULE | Freq: Three times a day (TID) | ORAL | 0 refills | Status: AC | PRN

## 2024-07-29 MED ORDER — POTASSIUM CHLORIDE CRYS ER 20 MEQ PO TBCR
40.0000 meq | EXTENDED_RELEASE_TABLET | Freq: Once | ORAL | Status: AC
  Administered 2024-07-29: 40 meq via ORAL
  Filled 2024-07-29: qty 2

## 2024-07-29 NOTE — Plan of Care (Signed)

## 2024-07-29 NOTE — Discharge Summary (Signed)
 [facility]  Physician Discharge Summary   Patient ID: Kenneth Keller MRN: [id number] DOB/AGE: June 20, 2006 18 y.o.  Admit date: 07/26/2024 Discharge date:   07/29/2024   PCP: Dorothyann Ped, MD  DISCHARGE DIAGNOSES:    Intractable nausea and vomiting   Cannabis hyperemesis syndrome concurrent with and due to cannabis abuse (HCC)   Hypokalemia   MDD (major depressive disorder), recurrent episode (HCC) Sinus tachycardia  RECOMMENDATIONS FOR OUTPATIENT FOLLOW UP: Follow-up with outpatient provider within the next 1 week to monitor vital signs   Home Health: None Equipment/Devices: None  CODE STATUS: Full code  DISCHARGE CONDITION: fair  Diet recommendation: Soft bland diet for now  INITIAL HISTORY: 18 y.o. male with past medical history of cannabinoid hyperemesis syndrome, anxiety and depression, who was last hospitalized for intractable nausea and vomiting in August.  He was also hospitalized for similar concern in July.  He mentions that about a week ago he was at [facility] where he consumed a lot of marijuana products.  But since then he has not had any.  He denies frequent alcohol use.  Denies any recreational drug use.  He states that he has been constipated for a week.  He came into the emergency department with complaints of nausea vomiting abdominal pain.  Despite treatment in the emergency department he did not improve.  So he was hospitalized for further management.     HOSPITAL COURSE:   Intractable nausea and vomiting Etiology is likely multifactorial including cannabis use and constipation.  He did have a bowel movement prior to admission at home after he took a lot of laxatives. CT scan of the abdomen pelvis was reassuringly normal. Patient was also started on PPI and Carafate .  Slowly improving.  Tolerated his supper last night.  Has not had any nausea vomiting.  He will eat breakfast this morning and if he does well he can be discharged home.  He  has been counseled to stop using cannabis products.   Constipation Had a bowel movement prior to arrival to the ED after several days of being constipated.  TSH is noted to be normal.  Continue MiraLAX .  Supple   Hypokalemia Potassium prior to discharge.   Elevated lipase and mildly elevated AST Unclear significance.  Elevated lipase level most likely due to nausea and vomiting.  Now normal.     Leukocytosis Most likely reactive.  No obvious source of infection identified.  Now resolved   Mild hypercalcemia Most likely due to hypovolemia.  Resolved.   Previous history of QT prolonging agent None noted on current EKG.  Suspected QT prolongation was most likely due to electrolyte imbalance previously.   Sinus tachycardia Patient mentions that he gets very tachycardic when he is in the hospital.  Previous notes reviewed.  Never has had this degree of tachycardia previously.   Heart rate was initially in the 140s but has improved.  Still tachycardic.  TSH is normal.  Anxiety is also contributing to his tachycardia. He is completely asymptomatic. Echocardiogram was ordered but patient keen on going home today.  If it cannot be done prior to discharge that this can be pursued by his outpatient providers.     History of depression and anxiety Continue with the psychotropic medications.   Patient is stable.  Feels much better this morning and wants to go home.  Okay for discharge home today.   PERTINENT LABS:  The results of significant diagnostics from this hospitalization (including imaging, microbiology, ancillary and laboratory) are listed  below for reference.     Labs:   Basic Metabolic Panel: Recent Labs  Lab 07/26/24 2117 07/26/24 2119 07/27/24 1707 07/28/24 0408 07/29/24 0412  NA 140  --  143 140 138  K 3.3*  --  3.8 3.7 3.2*  CL 101  --  107 105 103  CO2 18*  --  22 20* 22  GLUCOSE 132*  --  102* 114* 104*  BUN 5*  --  <5* 5* 6  CREATININE 1.04  --  0.74 0.76  0.74  CALCIUM  10.6*  --  9.9 9.7 9.7  MG  --  1.9 2.2 2.4  --    Liver Function Tests: Recent Labs  Lab 07/26/24 2117 07/27/24 1707 07/28/24 0408  AST 46* 38 37  ALT 20 20 20   ALKPHOS 71 63 63  BILITOT 0.4 0.6 0.6  PROT 7.7 6.7 6.9  ALBUMIN 5.2* 4.7 4.7   Recent Labs  Lab 07/26/24 2117 07/27/24 1707  LIPASE 59* 25    CBC: Recent Labs  Lab 07/26/24 2117 07/27/24 1707 07/28/24 0408  WBC 13.3* 10.6* 10.5  HGB 15.6 14.9 15.4  HCT 45.2 46.0 46.3  MCV 83.7 87.0 87.0  PLT 380 349 312      IMAGING STUDIES CT ABDOMEN PELVIS W CONTRAST Result Date: 07/27/2024 CLINICAL DATA:  Left upper quadrant pain, nausea, vomiting EXAM: CT ABDOMEN AND PELVIS WITH CONTRAST TECHNIQUE: Multidetector CT imaging of the abdomen and pelvis was performed using the standard protocol following bolus administration of intravenous contrast. RADIATION DOSE REDUCTION: This exam was performed according to the departmental dose-optimization program which includes automated exposure control, adjustment of the mA and/or kV according to patient size and/or use of iterative reconstruction technique. CONTRAST:  75mL OMNIPAQUE  IOHEXOL  300 MG/ML  SOLN COMPARISON:  06/01/2024 FINDINGS: Lower chest: No acute findings Hepatobiliary: No focal hepatic abnormality. Gallbladder unremarkable. Pancreas: No focal abnormality or ductal dilatation. Spleen: No focal abnormality.  Normal size. Adrenals/Urinary Tract: No adrenal abnormality. No focal renal abnormality. No stones or hydronephrosis. Urinary bladder is unremarkable. Stomach/Bowel: Normal appendix. Stomach, large and small bowel grossly unremarkable. Vascular/Lymphatic: No evidence of aneurysm or adenopathy. Reproductive: No visible focal abnormality. Other: No free fluid or free air. Musculoskeletal: No acute bony abnormality. IMPRESSION: No acute findings in the abdomen or pelvis. Electronically Signed   By: Franky Crease M.D.   On: 07/27/2024 00:49    DISCHARGE  EXAMINATION: Vitals:   07/28/24 0453 07/28/24 1357 07/28/24 2012 07/29/24 0519  BP: 136/82 122/89 132/89 (!) 144/96  Pulse:  90 (!) 110 (!) 110  Resp: 18 16 16 18   Temp: 98.9 F (37.2 C) 98.6 F (37 C) 99.4 F (37.4 C) 98.5 F (36.9 C)  TempSrc: Oral Oral Oral Oral  SpO2: 97% 98% 96% 95%   General appearance: Awake alert.  In no distress Resp: Clear to auscultation bilaterally.  Normal effort Cardio: S1-S2 is normal regular.  No S3-S4.  No rubs murmurs or bruit GI: Abdomen is soft.  Nontender nondistended.  Bowel sounds are present normal.  No masses organomegaly  DISPOSITION: Home  Discharge Instructions     Call MD for:  difficulty breathing, headache or visual disturbances   Complete by: As directed    Call MD for:  extreme fatigue   Complete by: As directed    Call MD for:  persistant dizziness or light-headedness   Complete by: As directed    Call MD for:  persistant nausea and vomiting   Complete by: As directed  Call MD for:  severe uncontrolled pain   Complete by: As directed    Call MD for:  temperature >100.4   Complete by: As directed    Diet - low sodium heart healthy   Complete by: As directed    Discharge instructions   Complete by: As directed    Please take your medications as prescribed.  Please be sure to follow-up with your primary care provider.  Please stop cannabis use in any form.  You were cared for by a hospitalist during your hospital stay. If you have any questions about your discharge medications or the care you received while you were in the hospital after you are discharged, you can call the unit and asked to speak with the hospitalist on call if the hospitalist that took care of you is not available. Once you are discharged, your primary care physician will handle any further medical issues. Please note that NO REFILLS for any discharge medications will be authorized once you are discharged, as it is imperative that you return to your primary  care physician (or establish a relationship with a primary care physician if you do not have one) for your aftercare needs so that they can reassess your need for medications and monitor your lab values. If you do not have a primary care physician, you can call [phone] for a physician referral.   Increase activity slowly   Complete by: As directed          Allergies as of 07/29/2024       Reactions   Other Other (See Comments)   Pet dander and seasonal allergies (exposure results in allergic symptoms)        Medication List     STOP taking these medications    ibuprofen  200 MG tablet Commonly known as: ADVIL        TAKE these medications    acetaminophen  500 MG tablet Commonly known as: TYLENOL  Take 500-1,000 mg by mouth every 6 (six) hours as needed for moderate pain (pain score 4-6).   calcium  carbonate 500 MG chewable tablet Commonly known as: TUMS - dosed in mg elemental calcium  Chew 2 tablets by mouth as needed for indigestion or heartburn.   Creatine Powd 1 Dose by Does not apply route daily.   dicyclomine  10 MG capsule Commonly known as: BENTYL  Take 1 capsule (10 mg total) by mouth 3 (three) times daily as needed for spasms.   Lybalvi  10-10 MG Tabs Generic drug: OLANZapine -Samidorphan Take 1 tablet by mouth daily.   ondansetron  4 MG disintegrating tablet Commonly known as: ZOFRAN -ODT Take 1 tablet (4 mg total) by mouth every 6 (six) hours as needed for nausea or vomiting.   pantoprazole  40 MG tablet Commonly known as: Protonix  Take 1 tablet (40 mg total) by mouth daily.   polyethylene glycol 17 g packet Commonly known as: MIRALAX  / GLYCOLAX  Take 17 g by mouth daily. Start taking on: July 30, 2024   potassium chloride  SA 20 MEQ tablet Commonly known as: KLOR-CON  M Take 1 tablet (20 mEq total) by mouth daily for 5 days.   sucralfate  1 g tablet Commonly known as: Carafate  Take 1 tablet (1 g total) by mouth 4 (four) times daily -  with  meals and at bedtime.   UNABLE TO FIND Take 1 drop by mouth daily. Research Medication: FX322 Liquid   viloxazine ER 200 MG 24 hr capsule Commonly known as: Qelbree  Take 1 capsule (200 mg total) by mouth daily.  TOTAL DISCHARGE TIME: 35 minutes  Adamariz Gillott [employer] on [email or web address]  07/29/2024, 9:20 AM
